# Patient Record
Sex: Female | Born: 1990 | Race: White | Hispanic: No | Marital: Married | Smoking: Never smoker
Health system: Southern US, Community
[De-identification: ages and names within clinical notes are randomized; demographics above are authoritative.]

## PROBLEM LIST (undated history)

## (undated) DIAGNOSIS — Z8489 Family history of other specified conditions: Secondary | ICD-10-CM

## (undated) HISTORY — PX: BREAST SURGERY: SHX581

---

## 2016-10-24 HISTORY — PX: BREAST ENHANCEMENT SURGERY: SHX7

## 2018-10-24 NOTE — L&D Delivery Note (Signed)
Delivery Note At 11:40 AM a viable female was delivered via Vaginal, Spontaneous (Presentation: ;  ).  APGAR: 9, 9; weight  .   Placenta status: , .  Cord:  with the following complications: .  Cord pH: not sent  Anesthesia:   Episiotomy: None Lacerations: 2nd degree Suture Repair: 2.0 chromic Est. Blood Loss (mL): 319  Mom to postpartum.  Baby to Couplet care / Skin to Skin. Mom plans to BF and maybe IUD pp Plans in patient circ Gabriela Giannelli A Yamile Roedl 05/26/2019, 12:37 PM

## 2018-10-25 LAB — OB RESULTS CONSOLE RUBELLA ANTIBODY, IGM: Rubella: IMMUNE

## 2018-10-31 LAB — OB RESULTS CONSOLE GC/CHLAMYDIA: Gonorrhea: NEGATIVE

## 2019-03-04 LAB — OB RESULTS CONSOLE GC/CHLAMYDIA: Chlamydia: NEGATIVE

## 2019-04-25 LAB — OB RESULTS CONSOLE HIV ANTIBODY (ROUTINE TESTING): HIV: NONREACTIVE

## 2019-04-28 LAB — OB RESULTS CONSOLE GBS: GBS: NEGATIVE

## 2019-05-26 ENCOUNTER — Encounter (HOSPITAL_COMMUNITY): Payer: Self-pay

## 2019-05-26 ENCOUNTER — Other Ambulatory Visit: Payer: Self-pay

## 2019-05-26 ENCOUNTER — Inpatient Hospital Stay (HOSPITAL_COMMUNITY)
Admission: AD | Admit: 2019-05-26 | Discharge: 2019-05-27 | DRG: 806 | Disposition: A | Discharge status: Home / Self Care | Payer: Managed Care, Other (non HMO) | Attending: Obstetrics & Gynecology | Admitting: Obstetrics & Gynecology

## 2019-05-26 DIAGNOSIS — Z3A39 39 weeks gestation of pregnancy: Secondary | ICD-10-CM

## 2019-05-26 DIAGNOSIS — Z20828 Contact with and (suspected) exposure to other viral communicable diseases: Secondary | ICD-10-CM | POA: Diagnosis present

## 2019-05-26 DIAGNOSIS — Z3490 Encounter for supervision of normal pregnancy, unspecified, unspecified trimester: Secondary | ICD-10-CM

## 2019-05-26 DIAGNOSIS — O26893 Other specified pregnancy related conditions, third trimester: Secondary | ICD-10-CM | POA: Diagnosis present

## 2019-05-26 LAB — CBC
HCT: 39.6 % (ref 36.0–46.0)
Hemoglobin: 13.2 g/dL (ref 12.0–15.0)
MCH: 29.5 pg (ref 26.0–34.0)
MCHC: 33.3 g/dL (ref 30.0–36.0)
MCV: 88.4 fL (ref 80.0–100.0)
Platelets: 188 10*3/uL (ref 150–400)
RBC: 4.48 MIL/uL (ref 3.87–5.11)
RDW: 12.6 % (ref 11.5–15.5)
WBC: 15.9 10*3/uL — ABNORMAL HIGH (ref 4.0–10.5)
nRBC: 0 % (ref 0.0–0.2)

## 2019-05-26 LAB — TYPE AND SCREEN
ABO/RH(D): B POS
Antibody Screen: NEGATIVE

## 2019-05-26 LAB — ABO/RH: ABO/RH(D): B POS

## 2019-05-26 LAB — SARS CORONAVIRUS 2 BY RT PCR (HOSPITAL ORDER, PERFORMED IN ~~LOC~~ HOSPITAL LAB): SARS Coronavirus 2: NEGATIVE

## 2019-05-26 LAB — RPR: RPR Ser Ql: NONREACTIVE

## 2019-05-26 MED ORDER — OXYTOCIN 40 UNITS IN NORMAL SALINE INFUSION - SIMPLE MED
2.5000 [IU]/h | INTRAVENOUS | Status: DC
  Filled 2019-05-26: qty 1000

## 2019-05-26 MED ORDER — COCONUT OIL OIL
1.0000 "application " | TOPICAL_OIL | Status: DC | PRN
  Administered 2019-05-26: 1 via TOPICAL

## 2019-05-26 MED ORDER — ONDANSETRON HCL 4 MG/2ML IJ SOLN: 4.0000 mg | INTRAMUSCULAR | Status: DC | PRN

## 2019-05-26 MED ORDER — WITCH HAZEL-GLYCERIN EX PADS: 1.0000 "application " | MEDICATED_PAD | CUTANEOUS | Status: DC | PRN

## 2019-05-26 MED ORDER — METHYLERGONOVINE MALEATE 0.2 MG PO TABS
0.2000 mg | ORAL_TABLET | Freq: Four times a day (QID) | ORAL | Status: DC
  Administered 2019-05-26 – 2019-05-27 (×5): 0.2 mg via ORAL
  Filled 2019-05-26 (×5): qty 1

## 2019-05-26 MED ORDER — SENNOSIDES-DOCUSATE SODIUM 8.6-50 MG PO TABS
2.0000 | ORAL_TABLET | ORAL | Status: DC
  Filled 2019-05-26: qty 2

## 2019-05-26 MED ORDER — DIPHENHYDRAMINE HCL 25 MG PO CAPS: 25.0000 mg | ORAL_CAPSULE | Freq: Four times a day (QID) | ORAL | Status: DC | PRN

## 2019-05-26 MED ORDER — SIMETHICONE 80 MG PO CHEW: 80.0000 mg | CHEWABLE_TABLET | ORAL | Status: DC | PRN

## 2019-05-26 MED ORDER — ZOLPIDEM TARTRATE 5 MG PO TABS: 5.0000 mg | ORAL_TABLET | Freq: Every evening | ORAL | Status: DC | PRN

## 2019-05-26 MED ORDER — ACETAMINOPHEN 325 MG PO TABS: 650.0000 mg | ORAL_TABLET | ORAL | Status: DC | PRN

## 2019-05-26 MED ORDER — LACTATED RINGERS IV SOLN
500.0000 mL | INTRAVENOUS | Status: DC | PRN
  Administered 2019-05-26: 500 mL via INTRAVENOUS

## 2019-05-26 MED ORDER — DIBUCAINE (PERIANAL) 1 % EX OINT: 1.0000 "application " | TOPICAL_OINTMENT | CUTANEOUS | Status: DC | PRN

## 2019-05-26 MED ORDER — IBUPROFEN 600 MG PO TABS
600.0000 mg | ORAL_TABLET | Freq: Four times a day (QID) | ORAL | Status: DC
  Filled 2019-05-26 (×4): qty 1

## 2019-05-26 MED ORDER — OXYCODONE-ACETAMINOPHEN 5-325 MG PO TABS: 1.0000 | ORAL_TABLET | ORAL | Status: DC | PRN

## 2019-05-26 MED ORDER — BENZOCAINE-MENTHOL 20-0.5 % EX AERO
1.0000 "application " | INHALATION_SPRAY | CUTANEOUS | Status: DC | PRN
  Administered 2019-05-26: 1 via TOPICAL
  Filled 2019-05-26: qty 56

## 2019-05-26 MED ORDER — LACTATED RINGERS IV SOLN
INTRAVENOUS | Status: DC
  Administered 2019-05-26: 05:00:00 via INTRAVENOUS

## 2019-05-26 MED ORDER — FLEET ENEMA 7-19 GM/118ML RE ENEM: 1.0000 | ENEMA | RECTAL | Status: DC | PRN

## 2019-05-26 MED ORDER — OXYCODONE-ACETAMINOPHEN 5-325 MG PO TABS: 2.0000 | ORAL_TABLET | ORAL | Status: DC | PRN

## 2019-05-26 MED ORDER — OXYTOCIN BOLUS FROM INFUSION
500.0000 mL | Freq: Once | INTRAVENOUS | Status: AC
  Administered 2019-05-26: 12:00:00 500 mL via INTRAVENOUS

## 2019-05-26 MED ORDER — MEASLES, MUMPS & RUBELLA VAC IJ SOLR: 0.5000 mL | Freq: Once | INTRAMUSCULAR | Status: DC

## 2019-05-26 MED ORDER — PRENATAL MULTIVITAMIN CH
1.0000 | ORAL_TABLET | Freq: Every day | ORAL | Status: DC
  Filled 2019-05-26: qty 1

## 2019-05-26 MED ORDER — LIDOCAINE HCL (PF) 1 % IJ SOLN
30.0000 mL | INTRAMUSCULAR | Status: AC | PRN
  Administered 2019-05-26: 30 mL via SUBCUTANEOUS
  Filled 2019-05-26: qty 30

## 2019-05-26 MED ORDER — FENTANYL CITRATE (PF) 100 MCG/2ML IJ SOLN
50.0000 ug | INTRAMUSCULAR | Status: DC | PRN
  Administered 2019-05-26 (×2): 100 ug via INTRAVENOUS
  Filled 2019-05-26 (×2): qty 2

## 2019-05-26 MED ORDER — SOD CITRATE-CITRIC ACID 500-334 MG/5ML PO SOLN: 30.0000 mL | ORAL | Status: DC | PRN

## 2019-05-26 MED ORDER — TETANUS-DIPHTH-ACELL PERTUSSIS 5-2.5-18.5 LF-MCG/0.5 IM SUSP: 0.5000 mL | Freq: Once | INTRAMUSCULAR | Status: DC

## 2019-05-26 MED ORDER — ONDANSETRON HCL 4 MG PO TABS: 4.0000 mg | ORAL_TABLET | ORAL | Status: DC | PRN

## 2019-05-26 MED ORDER — ONDANSETRON HCL 4 MG/2ML IJ SOLN
4.0000 mg | Freq: Four times a day (QID) | INTRAMUSCULAR | Status: DC | PRN
  Administered 2019-05-26: 4 mg via INTRAVENOUS
  Filled 2019-05-26: qty 2

## 2019-05-26 NOTE — H&P (Addendum)
Avaleen Brownley is a 28 y.o. female presenting with ROM at 0100 clear fluid per RN report.  Called by RN with report of ROM and contractions every 3-24min.  Pt reported to RN she was 1cm in the office on Thurs but cervix difficult to reach on exam in MAU by RN.  OB History    Gravida  1   Para      Term      Preterm      AB      Living        SAB      TAB      Ectopic      Multiple      Live Births             History reviewed. No pertinent past medical history.  Family History: DM, HTN, Malignant Melanoma, Breast CA, Dementia Social History:  has no history on file for tobacco, alcohol, and drug.     Maternal Diabetes: No Genetic Screening: Declined Maternal Ultrasounds/Referrals: Normal (rt CPC early in pregnancy resolved on ultrasound at 27wks) Fetal Ultrasounds or other Referrals:  None Maternal Substance Abuse:  No Significant Maternal Medications:  None Significant Maternal Lab Results:  Group B Strep negative Other Comments:  Rt CPC early in pregnancy resolved on ultrasound at 27wks  ROS  Non-contributory, no F/C/N/V/D  Maternal Medical History:  Reason for admission: Rupture of membranes and contractions.   Contractions: Onset was 1-2 hours ago.   Frequency: irregular.    Fetal activity: Perceived fetal activity is normal.    Prenatal complications: no prenatal complications Prenatal Complications - Diabetes: none.    Dilation: (unable to reach cervix, pt uncomfortable) Blood pressure 121/80, pulse 64, temperature 98.1 F (36.7 C), temperature source Oral, resp. rate 17, height 5\' 9"  (1.753 m), weight 82 kg. Exam Physical Exam  VE (last done in office) 1/70/-2 FHT 120s, + accels, no decels, mod variability Toco not picking up well but reported as every 3-38min U/S in office on 04/24/19 with EFW 5lbs 8oz, vtx, nl fluid  Prenatal labs: ABO, Rh:  B positive Antibody:  Negative Rubella: Immune (01/02 0000) RPR:   NR HBsAg:   NR HIV: Non-reactive  (07/02 0000)  GBS: Negative (07/05 0000)   Assessment/Plan: P0 at 39 5/7 wks with SROM at 0100 with contractions every 3-66min and GBS neg.  Uncomplicated PN course.  Will observe and augment labor as indicated.  Cat 1 tracing.  Pt may have pain medication upon request.   Margaret [name] 05/26/2019, 3:47 AM

## 2019-05-26 NOTE — Progress Notes (Signed)
Pt without c/o BP 115/81 (BP Location: Right Arm)   Pulse 77   Temp 98.9 F (37.2 C) (Oral)   Resp 19   Ht 5\' 9"  (1.753 m)   Wt 82 kg   Breastfeeding Unknown   BMI 26.70 kg/m  Fundus firm  Small amount of lochia noted PPH Pitocin and methergine given Pt stable.  Will monitor.

## 2019-05-26 NOTE — MAU Note (Signed)
Pt reports water broke at 0100, clear fluid. Contractions started soon after, however not timing them. Denies vaginal bleeding. Reports good fetal movement. Cervix was 1/70 on Thursday.

## 2019-05-26 NOTE — Lactation Note (Signed)
This note was copied from a baby's chart. Lactation Consultation Note  Patient Name: Margaret [name] Date: 05/26/2019 Reason for consult: Initial assessment;Term;Primapara;1st time breastfeeding  P1 mother whose infant is now 21 hours old.  Baby was quiet and awake in father's arms when I arrived.  Mother has breast fed him twice since delivery.  Mother had a breast augmentation in 2018.    Mother's breasts are firm and nipples are short shafted bilaterally; left nipple almost flat.  The right nipple is reddened and slightly irritated.  Mother stated that baby latched "so quickly and fed hard"  Explained the importance of obtaining and maintaining a good deep latch.  Mother verbalized understanding.  Breast shells and manual pump provided with instructions for use.  #24 flange size is appropriate at this time.  RN has provided coconut oil and I encouraged using EBM to rub into nipples/areolas after feedings.  Mother will feed 8-12 times/24 hours or sooner if baby shows feeding cues.  Reviewed cues.  Colostrum container provided for any EBM she may obtain with hand expression.  Milk storage times discussed and finger feeding demonstrated.  Mother has a Medela DEBP for home use.  Mom made aware of O/P services, breastfeeding support groups, community resources, and our phone # for post-discharge questions. Father supportive.  RN updated.   Maternal Data Formula Feeding for Exclusion: No Has patient been taught Hand Expression?: Yes Does the patient have breastfeeding experience prior to this delivery?: No  Feeding Feeding Type: Breast Fed  LATCH Score Latch: Grasps breast easily, tongue down, lips flanged, rhythmical sucking.  Audible Swallowing: A few with stimulation  Type of Nipple: Everted at rest and after stimulation(short)  Comfort (Breast/Nipple): Filling, red/small blisters or bruises, mild/mod discomfort  Hold (Positioning): Assistance needed to correctly position  infant at breast and maintain latch.  LATCH Score: 7  Interventions Interventions: Breast feeding basics reviewed;Assisted with latch;Skin to skin;Breast massage  Lactation Tools Discussed/Used WIC Program: No Pump Review: Setup, frequency, and cleaning;Milk Storage Initiated by:: Shephanie Romas Date initiated:: 05/26/19   Consult Status Consult Status: Follow-up Date: 05/27/19 Follow-up type: In-patient    Little Ishikawa 05/26/2019, 3:51 PM

## 2019-05-27 LAB — CBC
HCT: 32.6 % — ABNORMAL LOW (ref 36.0–46.0)
Hemoglobin: 11.1 g/dL — ABNORMAL LOW (ref 12.0–15.0)
MCH: 29.4 pg (ref 26.0–34.0)
MCHC: 34 g/dL (ref 30.0–36.0)
MCV: 86.5 fL (ref 80.0–100.0)
Platelets: 149 10*3/uL — ABNORMAL LOW (ref 150–400)
RBC: 3.77 MIL/uL — ABNORMAL LOW (ref 3.87–5.11)
RDW: 12.6 % (ref 11.5–15.5)
WBC: 17.4 10*3/uL — ABNORMAL HIGH (ref 4.0–10.5)
nRBC: 0 % (ref 0.0–0.2)

## 2019-05-27 NOTE — Discharge Summary (Signed)
Obstetric Discharge Summary Reason for Admission: rupture of membranes Prenatal Procedures: NST Intrapartum Procedures: spontaneous vaginal delivery Postpartum Procedures: none Complications-Operative and Postpartum: none Hemoglobin  Date Value Ref Range Status  05/27/2019 11.1 (L) 12.0 - 15.0 g/dL Final   HCT  Date Value Ref Range Status  05/27/2019 32.6 (L) 36.0 - 46.0 % Final    Physical Exam:  General: alert, cooperative and no distress Lochia: appropriate Uterine Fundus: firm perineum: healing well DVT Evaluation: No evidence of DVT seen on physical exam. Negative Homan's sign. No cords or calf tenderness.  Discharge Diagnoses: Term Pregnancy-delivered  Discharge Information: Date: 05/27/2019 Activity: pelvic rest Diet: routine Medications: PNV Condition: stable Instructions: refer to practice specific booklet Discharge to: home   Newborn Data: Live born female  Birth Weight: 8 lb 1.3 oz (3666 g) APGAR: 32, 9  Newborn Delivery   Birth date/time: 05/26/2019 11:40:00 Delivery type: Vaginal, Spontaneous      Home with mother.  Sanjuana Kava STACIA 05/27/2019, 11:20 AM

## 2019-05-27 NOTE — Lactation Note (Signed)
This note was copied from a baby's chart. Lactation Consultation Note  Patient Name: Margaret [name] [id number] Date: 05/27/2019 Reason for consult: Follow-up assessment;Term;Primapara;1st time breastfeeding  P1 mother whose infant is now 77 hours old.  Baby was asleep in mother's arms when I arrived.  Discussed circumcision and its possible effects on breast feeding.    Mother is pleased that baby is latching and feeding well.  She has gotten good results from wearing the breast shells and does not even have to use the manual pump to help evert nipples prior to latching.  She will continue to feed 8-12 times/24 hours of sooner if baby shows feeding cues.    Discussed cluster feeding.  Engorgement prevention/treatment reviewed.  Mother has a DEBP for home use.  She also has our OP phone number for any questions/concerns after discharge.    Father present and supportive.   Maternal Data Formula Feeding for Exclusion: No Has patient been taught Hand Expression?: Yes Does the patient have breastfeeding experience prior to this delivery?: No  Feeding Feeding Type: Breast Fed  LATCH Score Latch: Grasps breast easily, tongue down, lips flanged, rhythmical sucking.  Audible Swallowing: A few with stimulation  Type of Nipple: Everted at rest and after stimulation  Comfort (Breast/Nipple): Soft / non-tender  Hold (Positioning): Assistance needed to correctly position infant at breast and maintain latch.  LATCH Score: 8  Interventions    Lactation Tools Discussed/Used WIC Program: No   Consult Status Consult Status: Complete Date: 05/27/19 Follow-up type: Call as needed    Margaret [name] 05/27/2019, 1:48 PM

## 2019-05-28 ENCOUNTER — Inpatient Hospital Stay (HOSPITAL_COMMUNITY)
Admission: AD | Admit: 2019-05-28 | Payer: Managed Care, Other (non HMO) | Source: Home / Self Care | Admitting: Obstetrics and Gynecology

## 2021-01-28 ENCOUNTER — Inpatient Hospital Stay (HOSPITAL_COMMUNITY)
Admission: AD | Admit: 2021-01-28 | Discharge: 2021-01-28 | Disposition: A | Discharge status: Home / Self Care | Payer: PRIVATE HEALTH INSURANCE | Attending: Obstetrics & Gynecology | Admitting: Obstetrics & Gynecology

## 2021-01-28 ENCOUNTER — Encounter (HOSPITAL_COMMUNITY): Payer: Self-pay | Admitting: *Deleted

## 2021-01-28 ENCOUNTER — Inpatient Hospital Stay (HOSPITAL_COMMUNITY): Payer: PRIVATE HEALTH INSURANCE

## 2021-01-28 DIAGNOSIS — O3680X0 Pregnancy with inconclusive fetal viability, not applicable or unspecified: Secondary | ICD-10-CM | POA: Diagnosis not present

## 2021-01-28 DIAGNOSIS — Z3A01 Less than 8 weeks gestation of pregnancy: Secondary | ICD-10-CM | POA: Diagnosis not present

## 2021-01-28 DIAGNOSIS — O209 Hemorrhage in early pregnancy, unspecified: Secondary | ICD-10-CM | POA: Insufficient documentation

## 2021-01-28 DIAGNOSIS — Z3A00 Weeks of gestation of pregnancy not specified: Secondary | ICD-10-CM | POA: Diagnosis not present

## 2021-01-28 LAB — CBC
HCT: 42.5 % (ref 36.0–46.0)
Hemoglobin: 14.1 g/dL (ref 12.0–15.0)
MCH: 28.5 pg (ref 26.0–34.0)
MCHC: 33.2 g/dL (ref 30.0–36.0)
MCV: 85.9 fL (ref 80.0–100.0)
Platelets: 265 10*3/uL (ref 150–400)
RBC: 4.95 MIL/uL (ref 3.87–5.11)
RDW: 12.3 % (ref 11.5–15.5)
WBC: 10.4 10*3/uL (ref 4.0–10.5)
nRBC: 0 % (ref 0.0–0.2)

## 2021-01-28 LAB — COMPREHENSIVE METABOLIC PANEL
ALT: 15 U/L (ref 0–44)
AST: 17 U/L (ref 15–41)
Albumin: 4 g/dL (ref 3.5–5.0)
Alkaline Phosphatase: 49 U/L (ref 38–126)
Anion gap: 6 (ref 5–15)
BUN: 9 mg/dL (ref 6–20)
CO2: 24 mmol/L (ref 22–32)
Calcium: 9.1 mg/dL (ref 8.9–10.3)
Chloride: 107 mmol/L (ref 98–111)
Creatinine, Ser: 0.73 mg/dL (ref 0.44–1.00)
GFR, Estimated: >60 mL/min (ref >60)
Glucose, Bld: 95 mg/dL (ref 70–99)
Potassium: 4 mmol/L (ref 3.5–5.1)
Sodium: 137 mmol/L (ref 135–145)
Total Bilirubin: 0.7 mg/dL (ref 0.3–1.2)
Total Protein: 6.6 g/dL (ref 6.5–8.1)

## 2021-01-28 LAB — TYPE AND SCREEN
ABO/RH(D): B POS
Antibody Screen: NEGATIVE

## 2021-01-28 LAB — POCT PREGNANCY, URINE: Preg Test, Ur: POSITIVE — AB

## 2021-01-28 LAB — HCG, QUANTITATIVE, PREGNANCY: hCG, Beta Chain, Quant, S: 17 m[IU]/mL — ABNORMAL HIGH (ref <5)

## 2021-01-28 NOTE — MAU Note (Signed)
Pt reports  She had a positive HPT  LMP 12/13/20. Had some bleeding today when she went to [facility]. Denies any pain or cramping. No recent intercourse.

## 2021-01-28 NOTE — MAU Provider Note (Signed)
History     [id number]  Arrival date and time: 01/28/21 1287    Chief Complaint  Patient presents with  . Vaginal Bleeding     HPI Margaret [name] is a 30 y.o. at [redacted]w[redacted]d by uncertain LMP, who presents for vaginal bleeding.   Patient goes to see [facility] Reports she has been taking oral contraceptives since her last delivery Last week she took a pregnancy test and it was positive Today she reports that she has had light vaginal bleeding that is ongoing especially presented for care She reports mild lower abdominal tightness but no pain No nausea or vomiting Pregnancy is an unplanned but highly desired --/--/PENDING (04/07 1121)  OB History    Gravida  2   Para  1   Term  1   Preterm      AB      Living  1     SAB      IAB      Ectopic      Multiple  0   Live Births  1           History reviewed. No pertinent past medical history.  Past Surgical History:  Procedure Laterality Date  . BREAST ENHANCEMENT SURGERY  2018    Family History  Problem Relation Age of Onset  . Cancer Maternal Grandmother   . Diabetes Mother     Social History   Socioeconomic History  . Marital status: Married    Spouse name: Not on file  . Number of children: Not on file  . Years of education: Not on file  . Highest education level: Not on file  Occupational History  . Not on file  Tobacco Use  . Smoking status: Never Smoker  . Smokeless tobacco: Never Used  Vaping Use  . Vaping Use: Never used  Substance and Sexual Activity  . Alcohol use: Not Currently  . Drug use: Not Currently  . Sexual activity: Yes  Other Topics Concern  . Not on file  Social History Narrative  . Not on file   Social Determinants of Health   Financial Resource Strain: Not on file  Food Insecurity: Not on file  Transportation Needs: Not on file  Physical Activity: Not on file  Stress: Not on file  Social Connections: Not on file  Intimate Partner Violence: Not on file    No Known  Allergies  No current facility-administered medications on file prior to encounter.   Current Outpatient Medications on File Prior to Encounter  Medication Sig Dispense Refill  . Prenatal Vit-Fe Fumarate-FA (MULTIVITAMIN-PRENATAL) 27-0.8 MG TABS tablet Take 1 tablet by mouth daily at 12 noon.       ROS Pertinent positives and negative per HPI, all others reviewed and negative  Physical Exam   BP 123/80 (BP Location: Right Arm)   Pulse 84   Temp 98 F (36.7 C) (Oral)   Resp 20   Ht 5\' 9"  (1.753 m)   Wt 76.7 kg   LMP 12/13/2020   SpO2 99%   BMI 24.96 kg/m   Patient Vitals for the past 24 hrs:  BP Temp Temp src Pulse Resp SpO2 Height Weight  01/28/21 1037 123/80 98 F (36.7 C) Oral 84 20 99 % -- --  01/28/21 1009 113/89 98.1 F (36.7 C) -- 81 18 -- 5\' 9"  (1.753 m) 76.7 kg    Physical Exam Vitals reviewed.  Constitutional:      General: She is not in acute distress.  Appearance: She is well-developed. She is not diaphoretic.  Eyes:     General: No scleral icterus. Pulmonary:     Effort: Pulmonary effort is normal. No respiratory distress.  Abdominal:     General: There is no distension.     Palpations: Abdomen is soft.     Tenderness: There is no abdominal tenderness. There is no guarding or rebound.  Skin:    General: Skin is warm and dry.  Neurological:     Mental Status: She is alert.     Coordination: Coordination normal.      Cervical Exam    Bedside Ultrasound Pt informed that the ultrasound is considered a limited OB ultrasound and is not intended to be a complete ultrasound exam.  Patient also informed that the ultrasound is not being completed with the intent of assessing for fetal or placental anomalies or any pelvic abnormalities.  Explained that the purpose of today's ultrasound is to assess for  viability.  Patient acknowledges the purpose of the exam and the limitations of the study.    My interpretation: unremarkable adnexa bilaterally, no  IUP visualized though there is some indistinct structure in LUS  FHT N/a  Labs Results for orders placed or performed during the hospital encounter of 01/28/21 (from the past 24 hour(s))  Pregnancy, urine POC     Status: Abnormal   Collection Time: 01/28/21 10:08 AM  Result Value Ref Range   Preg Test, Ur POSITIVE (A) NEGATIVE  Type and screen [facility]     Status: None (Preliminary result)   Collection Time: 01/28/21 11:21 AM  Result Value Ref Range   ABO/RH(D) PENDING    Antibody Screen PENDING    Sample Expiration      01/31/2021,2359 Performed at [facility], [address], [place], [place] [address]     Imaging US OB LESS THAN 14 WEEKS WITH [place] TRANSVAGINAL  Result Date: 01/28/2021 CLINICAL DATA:  30 year old female with pregnancy of unknown location. Estimated gestational age by LMP 6 weeks and 4 days. EXAM: OBSTETRIC <14 WK [place] AND TRANSVAGINAL OB US TECHNIQUE: Both transabdominal and transvaginal ultrasound examinations were performed for complete evaluation of the gestation as well as the maternal uterus, adnexal regions, and pelvic cul-de-sac. Transvaginal technique was performed to assess early pregnancy. COMPARISON:  None. FINDINGS: Intrauterine gestational sac: None. Maternal uterus/adnexae: Bland appearance of the endometrium (image 51), measuring 8-9 mm in thickness. No pelvic free fluid. Both ovaries appear normal. The right ovary measures 1.8 x 1.3 x 2.4 cm. The left ovary is 2.5 x 2.6 x 2.2 cm. IMPRESSION: No IUP, ovarian abnormality, or pelvic free fluid. Differential considerations include early IUP, failed IUP, occult ectopic pregnancy. Recommend serial quantitative beta HCG and repeat Ultrasound as necessary. Electronically Signed   By: Odessa Fleming M.D.   On: 01/28/2021 11:59    MAU Course  Procedures  Lab Orders     CBC     Comprehensive metabolic panel     hCG, quantitative, pregnancy     Pregnancy, urine POC No orders of the defined  types were placed in this encounter.   Imaging Orders     US OB LESS THAN 14 WEEKS WITH OB TRANSVAGINAL  MDM moderate  Assessment and Plan  #Pregnancy of unknown location Ultrasound without IUP or any other abnormality. Prior blood type on file is B+, repeat pending. Other labs pending. Will have her return in 2 days for recheck of hcg. Reviewed ectopic precautions.  Discharged to home in stable condition.  Venora Maples, MD/MPH 01/28/21 1:02 PM  Allergies as of 01/28/2021   No Known Allergies     Medication List    TAKE these medications   multivitamin-prenatal 27-0.8 MG Tabs tablet Take 1 tablet by mouth daily at 12 noon.

## 2021-01-28 NOTE — Discharge Instructions (Signed)
Abdominal Pain During Pregnancy Belly (abdominal) pain is common during pregnancy. There are many possible causes. Some causes are more serious than others. Sometimes the cause is not known. Always tell your doctor if you have belly pain. Follow these instructions at home:  Do not have sex or put anything in your vagina until your pain goes away completely.  Get plenty of rest until your pain gets better.  Drink enough fluid to keep your pee (urine) pale yellow.  Take over-the-counter and prescription medicines only as told by your doctor.  Keep all follow-up visits.   Contact a doctor if:  You keep having pain after resting.  Your pain gets worse after resting.  You have lower belly pain that: ? Comes and goes at regular times. ? Spreads to your back. ? Feels like menstrual cramps.  You have pain or burning when you pee (urinate). Get help right away if:  You have a fever or chills.  You feel like it is hard to breathe.  You have bleeding from your vagina.  You are leaking fluid or tissue from your vagina.  You vomit for more than 24 hours.  You have watery poop (diarrhea) for more than 24 hours.  Your baby is moving less than usual.  You feel very weak or faint.  You have very bad pain in your upper belly. Summary  Belly pain is common during pregnancy. There are many possible causes.  If you have belly pain during pregnancy, tell your doctor right away.  Keep all follow-up visits. This information is not intended to replace advice given to you by your health care provider. Make sure you discuss any questions you have with your health care provider. Document Revised: 06/23/2020 Document Reviewed: 06/23/2020 Elsevier Patient Education  2021 Elsevier Inc.  

## 2021-01-30 ENCOUNTER — Inpatient Hospital Stay (HOSPITAL_COMMUNITY)
Admission: AD | Admit: 2021-01-30 | Discharge: 2021-01-30 | Disposition: A | Discharge status: Home / Self Care | Payer: PRIVATE HEALTH INSURANCE | Attending: Obstetrics & Gynecology | Admitting: Obstetrics & Gynecology

## 2021-01-30 ENCOUNTER — Other Ambulatory Visit: Payer: Self-pay

## 2021-01-30 DIAGNOSIS — O039 Complete or unspecified spontaneous abortion without complication: Secondary | ICD-10-CM | POA: Diagnosis present

## 2021-01-30 DIAGNOSIS — Z3A01 Less than 8 weeks gestation of pregnancy: Secondary | ICD-10-CM | POA: Insufficient documentation

## 2021-01-30 LAB — HCG, QUANTITATIVE, PREGNANCY: hCG, Beta Chain, Quant, S: 5 m[IU]/mL — ABNORMAL HIGH (ref <5)

## 2021-01-30 NOTE — MAU Provider Note (Signed)
History   Chief Complaint:  Follow-up   Margaret [name] is  30 y.o. G2P1001 Patient's last menstrual period was 12/13/2020.Marland Kitchen Patient is here for follow up of quantitative HCG and ongoing surveillance of pregnancy status. She is [redacted]w[redacted]d weeks gestation  by LMP.    Since her last visit, the patient is without new complaint. The patient reports bleeding as  none now.  She denies any pain.  General ROS:  negative  Her previous Quantitative HCG values are:  Results for [name], [name] (MRN [id number]) as of 01/30/2021 11:58  Ref. Range 01/28/2021 11:21  HCG, Beta Chain, Quant, S Latest Ref Range: <5 mIU/mL 17 (H)   Physical Exam   Blood pressure 118/86, pulse 73, temperature 98.2 F (36.8 C), temperature source Oral, resp. rate 16, last menstrual period 12/13/2020, SpO2 100 %, unknown if currently breastfeeding.  Physical Exam Vitals and nursing note reviewed.  Constitutional:      General: She is not in acute distress.    Appearance: She is well-developed.  HENT:     Head: Normocephalic.  Eyes:     Pupils: Pupils are equal, round, and reactive to light.  Cardiovascular:     Rate and Rhythm: Normal rate.  Pulmonary:     Effort: Pulmonary effort is normal. No respiratory distress.  Musculoskeletal:        General: Normal range of motion.     Cervical back: Normal range of motion.  Skin:    General: Skin is warm and dry.  Neurological:     Mental Status: She is alert and oriented to person, place, and time.  Psychiatric:        Behavior: Behavior normal.        Thought Content: Thought content normal.        Judgment: Judgment normal.    Labs: Results for orders placed or performed during the hospital encounter of 01/30/21 (from the past 24 hour(s))  hCG, quantitative, pregnancy   Collection Time: 01/30/21 10:38 AM  Result Value Ref Range   hCG, Beta Chain, Quant, S 5 (H) <5 mIU/mL    Assessment:   1. Miscarriage   2. [redacted] weeks gestation of pregnancy     -Reviewed results with  patient. Discussed she has likely experienced all of the bleeding she will have with this process but also reviewed possibility of more pain or bleeding. Discussed warning signs at length. Patient desires to follow up with [facility]   Plan: -Discharge home in stable condition -Vaginal bleeding and pain precautions discussed -Patient advised to follow-up with OB in 1 week for repeat labs, patient will call office.  -Patient may return to MAU as needed or if her condition were to change or worsen  Rolm Bookbinder, CNM 01/30/2021, 11:35 AM

## 2021-01-30 NOTE — MAU Note (Signed)
Pt reports to mau for follow up lab work.  Pt states her bleeding has lightened to spotting.  Pt denies any pain today.

## 2021-05-05 LAB — OB RESULTS CONSOLE RUBELLA ANTIBODY, IGM: Rubella: IMMUNE

## 2021-05-05 LAB — OB RESULTS CONSOLE ABO/RH: RH Type: POSITIVE

## 2021-05-05 LAB — OB RESULTS CONSOLE ANTIBODY SCREEN: Antibody Screen: NEGATIVE

## 2021-05-05 LAB — OB RESULTS CONSOLE GC/CHLAMYDIA
Chlamydia: NEGATIVE
Gonorrhea: NEGATIVE

## 2021-05-05 LAB — OB RESULTS CONSOLE HIV ANTIBODY (ROUTINE TESTING): HIV: NONREACTIVE

## 2021-05-05 LAB — OB RESULTS CONSOLE HEPATITIS B SURFACE ANTIGEN: Hepatitis B Surface Ag: NEGATIVE

## 2021-05-05 LAB — HEPATITIS C ANTIBODY: HCV Ab: NEGATIVE

## 2021-05-05 LAB — OB RESULTS CONSOLE RPR: RPR: NONREACTIVE

## 2021-10-24 NOTE — L&D Delivery Note (Signed)
Delivery Note   Patient Name: Margaret [name] DOB: 01/23/1991 MRN: [id number]  Date of admission: 12/04/2021 Delivering MD: [place]  Date of delivery: 12/04/21 Type of delivery: SVD  Newborn Data: Live born female  Birth Weight: 6 lb 10.5 oz (3020 g) APGAR: 8, 9  Newborn Delivery   Birth date/time: 12/04/2021 17:35:00 Delivery type: Vaginal, Spontaneous     Margaret [name], 31 y.o., @ [redacted]w[redacted]d,  [phone], who was admitted for Margaret [name] is a 31 y.o. female, G2P1001, IUP at 40.2 weeks, presenting for pt was having painful cxt, pt made no cervical change in MAU has remained stable at 3cm over last four hour, pt verbalized desire to stay and ha an elective IOL. H/O bilat breast augmentation, and H/O PPH. GBS-. Pt progress to 10 cm with AROM, clear. I was called to the room when she progressed 2+ station in the second stage of labor.  She pushed for 15/min.  She delivered a viable infant, cephalic and restituted to the ROA position over an intact perineum.  A nuchal cord   was not identified. There was a right compound hand. The baby was placed on maternal abdomen while initial step of NRP were perfmored (Dry, Stimulated, and warmed). Hat placed on baby for thermoregulation. Delayed cord clamping was performed for 2 minutes.  Cord double clamped and cut.  Cord cut by FOB. Newborn has full body pustular rash noted sparing palms and sole of the feet, Peds called to assess at bedside. Apgar scores were 8 and 9. Prophylactic Pitocin was started in the third stage of labor for active management. The placenta delivered spontaneously, shultz, with a 3 vessel cord and was sent to pathology r/t newborn rash, and yellow dis coloration of the placenta.  Inspection revealed 1st degree. An examination of the vaginal vault and cervix was free from lacerations. The uterus was firm, bleeding stable.  The repair was done under epidural.  Umbilical artery blood gas were not sent.  There were no complications during the  procedure.  Mom and baby skin to skin following delivery. Left in stable condition.  Maternal Info: Anesthesia: Epidural Episiotomy: no Lacerations:  1st Suture Repair: 3.o vicryl Est. Blood Loss (mL):   Newborn Info:  Baby Sex: female Circumcision: in pt desired Babies Name: Margaret [name] APGAR (1 MIN): 8   APGAR (5 MINS): 9   APGAR (10 MINS):     Mom to postpartum.  Baby to Couplet care / Skin to Skin.  Dr Normand Sloop updated.   [place], [place], NP-C 12/04/21 6:17 PM

## 2021-11-11 LAB — OB RESULTS CONSOLE GBS
GBS: NEGATIVE
GBS: NEGATIVE

## 2021-11-26 ENCOUNTER — Telehealth (HOSPITAL_COMMUNITY): Payer: Self-pay | Admitting: *Deleted

## 2021-11-26 ENCOUNTER — Other Ambulatory Visit: Payer: Self-pay | Admitting: Obstetrics & Gynecology

## 2021-11-26 NOTE — Telephone Encounter (Signed)
Preadmission screen  

## 2021-11-29 ENCOUNTER — Telehealth (HOSPITAL_COMMUNITY): Payer: Self-pay | Admitting: *Deleted

## 2021-11-29 NOTE — Telephone Encounter (Signed)
Preadmission screen  

## 2021-11-30 ENCOUNTER — Telehealth (HOSPITAL_COMMUNITY): Payer: Self-pay | Admitting: *Deleted

## 2021-11-30 NOTE — Telephone Encounter (Signed)
Preadmission screen  

## 2021-12-01 ENCOUNTER — Telehealth (HOSPITAL_COMMUNITY): Payer: Self-pay | Admitting: *Deleted

## 2021-12-01 NOTE — Telephone Encounter (Signed)
Preadmission screen  

## 2021-12-02 ENCOUNTER — Encounter (HOSPITAL_COMMUNITY): Payer: Self-pay | Admitting: *Deleted

## 2021-12-02 ENCOUNTER — Telehealth (HOSPITAL_COMMUNITY): Payer: Self-pay | Admitting: *Deleted

## 2021-12-02 NOTE — Telephone Encounter (Signed)
Preadmission screen  

## 2021-12-04 ENCOUNTER — Inpatient Hospital Stay (HOSPITAL_COMMUNITY)
Admission: AD | Admit: 2021-12-04 | Discharge: 2021-12-06 | DRG: 806 | Disposition: A | Discharge status: Home / Self Care | Payer: PRIVATE HEALTH INSURANCE | Attending: Obstetrics & Gynecology | Admitting: Obstetrics & Gynecology

## 2021-12-04 ENCOUNTER — Other Ambulatory Visit: Payer: Self-pay

## 2021-12-04 ENCOUNTER — Inpatient Hospital Stay (HOSPITAL_COMMUNITY): Payer: PRIVATE HEALTH INSURANCE | Admitting: Anesthesiology

## 2021-12-04 ENCOUNTER — Encounter (HOSPITAL_COMMUNITY): Payer: Self-pay | Admitting: Obstetrics and Gynecology

## 2021-12-04 DIAGNOSIS — Z3A40 40 weeks gestation of pregnancy: Secondary | ICD-10-CM

## 2021-12-04 DIAGNOSIS — D62 Acute posthemorrhagic anemia: Secondary | ICD-10-CM | POA: Diagnosis not present

## 2021-12-04 DIAGNOSIS — Z20822 Contact with and (suspected) exposure to covid-19: Secondary | ICD-10-CM | POA: Diagnosis present

## 2021-12-04 DIAGNOSIS — O9081 Anemia of the puerperium: Secondary | ICD-10-CM | POA: Diagnosis not present

## 2021-12-04 DIAGNOSIS — O480 Post-term pregnancy: Principal | ICD-10-CM | POA: Diagnosis present

## 2021-12-04 DIAGNOSIS — O26893 Other specified pregnancy related conditions, third trimester: Secondary | ICD-10-CM | POA: Diagnosis present

## 2021-12-04 DIAGNOSIS — O479 False labor, unspecified: Secondary | ICD-10-CM

## 2021-12-04 DIAGNOSIS — Z3490 Encounter for supervision of normal pregnancy, unspecified, unspecified trimester: Secondary | ICD-10-CM | POA: Diagnosis present

## 2021-12-04 HISTORY — DX: Family history of other specified conditions: Z84.89

## 2021-12-04 LAB — CBC
HCT: 37 % (ref 36.0–46.0)
HCT: 27.5 % — ABNORMAL LOW (ref 36.0–46.0)
Hemoglobin: 12.5 g/dL (ref 12.0–15.0)
Hemoglobin: 9.4 g/dL — ABNORMAL LOW (ref 12.0–15.0)
MCH: 29.3 pg (ref 26.0–34.0)
MCH: 29.5 pg (ref 26.0–34.0)
MCHC: 33.8 g/dL (ref 30.0–36.0)
MCHC: 34.2 g/dL (ref 30.0–36.0)
MCV: 86.9 fL (ref 80.0–100.0)
MCV: 86.2 fL (ref 80.0–100.0)
Platelets: 178 10*3/uL (ref 150–400)
Platelets: 182 10*3/uL (ref 150–400)
RBC: 4.26 MIL/uL (ref 3.87–5.11)
RBC: 3.19 MIL/uL — ABNORMAL LOW (ref 3.87–5.11)
RDW: 13.7 % (ref 11.5–15.5)
RDW: 13.4 % (ref 11.5–15.5)
WBC: 16.8 10*3/uL — ABNORMAL HIGH (ref 4.0–10.5)
WBC: 22.2 10*3/uL — ABNORMAL HIGH (ref 4.0–10.5)
nRBC: 0 % (ref 0.0–0.2)
nRBC: 0 % (ref 0.0–0.2)

## 2021-12-04 LAB — RESP PANEL BY RT-PCR (FLU A&B, COVID) ARPGX2
Influenza A by PCR: NEGATIVE
Influenza B by PCR: NEGATIVE
SARS Coronavirus 2 by RT PCR: NEGATIVE

## 2021-12-04 LAB — TYPE AND SCREEN
ABO/RH(D): B POS
Antibody Screen: NEGATIVE

## 2021-12-04 MED ORDER — DIBUCAINE (PERIANAL) 1 % EX OINT: 1.0000 "application " | TOPICAL_OINTMENT | CUTANEOUS | Status: DC | PRN

## 2021-12-04 MED ORDER — LIDOCAINE HCL (PF) 1 % IJ SOLN: 30.0000 mL | INTRAMUSCULAR | Status: DC | PRN

## 2021-12-04 MED ORDER — METHYLERGONOVINE MALEATE 0.2 MG PO TABS
0.2000 mg | ORAL_TABLET | Freq: Four times a day (QID) | ORAL | Status: AC
  Administered 2021-12-05 (×4): 0.2 mg via ORAL
  Filled 2021-12-04 (×4): qty 1

## 2021-12-04 MED ORDER — OXYCODONE-ACETAMINOPHEN 5-325 MG PO TABS: 1.0000 | ORAL_TABLET | ORAL | Status: DC | PRN

## 2021-12-04 MED ORDER — POLYSACCHARIDE IRON COMPLEX 150 MG PO CAPS
150.0000 mg | ORAL_CAPSULE | Freq: Every day | ORAL | Status: DC
  Administered 2021-12-05 – 2021-12-06 (×2): 150 mg via ORAL
  Filled 2021-12-04 (×2): qty 1

## 2021-12-04 MED ORDER — SOD CITRATE-CITRIC ACID 500-334 MG/5ML PO SOLN: 30.0000 mL | ORAL | Status: DC | PRN

## 2021-12-04 MED ORDER — PHENYLEPHRINE 40 MCG/ML (10ML) SYRINGE FOR IV PUSH (FOR BLOOD PRESSURE SUPPORT)
80.0000 ug | PREFILLED_SYRINGE | INTRAVENOUS | Status: DC | PRN
  Filled 2021-12-04: qty 10

## 2021-12-04 MED ORDER — FENTANYL-BUPIVACAINE-NACL 0.5-0.125-0.9 MG/250ML-% EP SOLN
12.0000 mL/h | EPIDURAL | Status: DC | PRN
  Administered 2021-12-04: 12 mL/h via EPIDURAL
  Filled 2021-12-04: qty 250

## 2021-12-04 MED ORDER — CARBOPROST TROMETHAMINE 250 MCG/ML IM SOLN
INTRAMUSCULAR | Status: AC
  Administered 2021-12-04: 250 ug
  Filled 2021-12-04: qty 1

## 2021-12-04 MED ORDER — LACTATED RINGERS IV BOLUS: 1000.0000 mL | Freq: Once | INTRAVENOUS | Status: AC

## 2021-12-04 MED ORDER — TRANEXAMIC ACID-NACL 1000-0.7 MG/100ML-% IV SOLN
INTRAVENOUS | Status: AC
  Administered 2021-12-04: 1000 mg
  Filled 2021-12-04: qty 100

## 2021-12-04 MED ORDER — LACTATED RINGERS IV SOLN: INTRAVENOUS | Status: DC

## 2021-12-04 MED ORDER — EPHEDRINE 5 MG/ML INJ: 10.0000 mg | INTRAVENOUS | Status: DC | PRN

## 2021-12-04 MED ORDER — FENTANYL CITRATE (PF) 100 MCG/2ML IJ SOLN
50.0000 ug | INTRAMUSCULAR | Status: DC | PRN
  Filled 2021-12-04: qty 2

## 2021-12-04 MED ORDER — TRANEXAMIC ACID-NACL 1000-0.7 MG/100ML-% IV SOLN: 1000.0000 mg | INTRAVENOUS | Status: AC

## 2021-12-04 MED ORDER — LIDOCAINE HCL (PF) 1 % IJ SOLN
INTRAMUSCULAR | Status: DC | PRN
  Administered 2021-12-04 (×2): 5 mL via EPIDURAL

## 2021-12-04 MED ORDER — ONDANSETRON HCL 4 MG/2ML IJ SOLN
4.0000 mg | Freq: Once | INTRAMUSCULAR | Status: AC
  Administered 2021-12-04: 4 mg via INTRAVENOUS

## 2021-12-04 MED ORDER — COCONUT OIL OIL: 1.0000 "application " | TOPICAL_OIL | Status: DC | PRN

## 2021-12-04 MED ORDER — ACETAMINOPHEN 325 MG PO TABS: 650.0000 mg | ORAL_TABLET | ORAL | Status: DC | PRN

## 2021-12-04 MED ORDER — OXYTOCIN-SODIUM CHLORIDE 30-0.9 UT/500ML-% IV SOLN: 2.5000 [IU]/h | INTRAVENOUS | Status: DC

## 2021-12-04 MED ORDER — DIPHENHYDRAMINE HCL 25 MG PO CAPS
25.0000 mg | ORAL_CAPSULE | Freq: Four times a day (QID) | ORAL | Status: DC | PRN
Start: 2021-12-04 — End: 2021-12-06

## 2021-12-04 MED ORDER — PRENATAL MULTIVITAMIN CH
1.0000 | ORAL_TABLET | Freq: Every day | ORAL | Status: DC
  Administered 2021-12-05: 1 via ORAL
  Filled 2021-12-04: qty 1

## 2021-12-04 MED ORDER — ONDANSETRON HCL 4 MG PO TABS: 4.0000 mg | ORAL_TABLET | ORAL | Status: DC | PRN

## 2021-12-04 MED ORDER — TRANEXAMIC ACID-NACL 1000-0.7 MG/100ML-% IV SOLN
INTRAVENOUS | Status: AC
  Filled 2021-12-04: qty 100

## 2021-12-04 MED ORDER — SODIUM CHLORIDE 0.9 % IV SOLN
500.0000 mg | Freq: Once | INTRAVENOUS | Status: AC
  Administered 2021-12-05: 500 mg via INTRAVENOUS
  Filled 2021-12-04: qty 25

## 2021-12-04 MED ORDER — OXYTOCIN-SODIUM CHLORIDE 30-0.9 UT/500ML-% IV SOLN
2.5000 [IU]/h | INTRAVENOUS | Status: DC
  Filled 2021-12-04: qty 500

## 2021-12-04 MED ORDER — OXYTOCIN BOLUS FROM INFUSION
333.0000 mL | Freq: Once | INTRAVENOUS | Status: AC
  Administered 2021-12-04: 333 mL via INTRAVENOUS

## 2021-12-04 MED ORDER — SENNOSIDES-DOCUSATE SODIUM 8.6-50 MG PO TABS
2.0000 | ORAL_TABLET | Freq: Every day | ORAL | Status: DC
  Administered 2021-12-06: 2 via ORAL
  Filled 2021-12-04: qty 2

## 2021-12-04 MED ORDER — OXYCODONE-ACETAMINOPHEN 5-325 MG PO TABS: 2.0000 | ORAL_TABLET | ORAL | Status: DC | PRN

## 2021-12-04 MED ORDER — DIPHENOXYLATE-ATROPINE 2.5-0.025 MG PO TABS
1.0000 | ORAL_TABLET | Freq: Once | ORAL | Status: AC
  Administered 2021-12-04: 1 via ORAL
  Filled 2021-12-04: qty 1

## 2021-12-04 MED ORDER — TETANUS-DIPHTH-ACELL PERTUSSIS 5-2.5-18.5 LF-MCG/0.5 IM SUSY: 0.5000 mL | PREFILLED_SYRINGE | Freq: Once | INTRAMUSCULAR | Status: DC

## 2021-12-04 MED ORDER — DIPHENHYDRAMINE HCL 50 MG/ML IJ SOLN: 12.5000 mg | INTRAMUSCULAR | Status: DC | PRN

## 2021-12-04 MED ORDER — ONDANSETRON HCL 4 MG/2ML IJ SOLN: 4.0000 mg | INTRAMUSCULAR | Status: DC | PRN

## 2021-12-04 MED ORDER — MORPHINE SULFATE (PF) 0.5 MG/ML IJ SOLN
INTRAMUSCULAR | Status: AC
  Filled 2021-12-04: qty 10

## 2021-12-04 MED ORDER — BENZOCAINE-MENTHOL 20-0.5 % EX AERO
1.0000 "application " | INHALATION_SPRAY | CUTANEOUS | Status: DC | PRN
  Administered 2021-12-05: 1 via TOPICAL
  Filled 2021-12-04: qty 56

## 2021-12-04 MED ORDER — LACTATED RINGERS IV SOLN: 500.0000 mL | Freq: Once | INTRAVENOUS | Status: DC

## 2021-12-04 MED ORDER — OXYTOCIN-SODIUM CHLORIDE 30-0.9 UT/500ML-% IV SOLN
INTRAVENOUS | Status: AC
  Filled 2021-12-04: qty 500

## 2021-12-04 MED ORDER — ONDANSETRON HCL 4 MG/2ML IJ SOLN
INTRAMUSCULAR | Status: AC
  Filled 2021-12-04: qty 2

## 2021-12-04 MED ORDER — FENTANYL CITRATE (PF) 100 MCG/2ML IJ SOLN
INTRAMUSCULAR | Status: AC
  Administered 2021-12-04: 100 ug via INTRAVENOUS
  Filled 2021-12-04: qty 4

## 2021-12-04 MED ORDER — IBUPROFEN 600 MG PO TABS
600.0000 mg | ORAL_TABLET | Freq: Four times a day (QID) | ORAL | Status: DC
  Administered 2021-12-04 – 2021-12-05 (×4): 600 mg via ORAL
  Filled 2021-12-04 (×5): qty 1

## 2021-12-04 MED ORDER — MISOPROSTOL 200 MCG PO TABS
ORAL_TABLET | ORAL | Status: AC
  Administered 2021-12-04: 1000 ug via RECTAL
  Filled 2021-12-04: qty 5

## 2021-12-04 MED ORDER — ZOLPIDEM TARTRATE 5 MG PO TABS: 5.0000 mg | ORAL_TABLET | Freq: Every evening | ORAL | Status: DC | PRN

## 2021-12-04 MED ORDER — LACTATED RINGERS IV SOLN: 500.0000 mL | INTRAVENOUS | Status: DC | PRN

## 2021-12-04 MED ORDER — ONDANSETRON HCL 4 MG/2ML IJ SOLN: 4.0000 mg | Freq: Four times a day (QID) | INTRAMUSCULAR | Status: DC | PRN

## 2021-12-04 MED ORDER — WITCH HAZEL-GLYCERIN EX PADS
1.0000 | MEDICATED_PAD | CUTANEOUS | Status: DC | PRN
Start: 2021-12-04 — End: 2021-12-06

## 2021-12-04 MED ORDER — PHENYLEPHRINE 40 MCG/ML (10ML) SYRINGE FOR IV PUSH (FOR BLOOD PRESSURE SUPPORT): 80.0000 ug | PREFILLED_SYRINGE | INTRAVENOUS | Status: DC | PRN

## 2021-12-04 MED ORDER — OXYTOCIN BOLUS FROM INFUSION: 333.0000 mL | Freq: Once | INTRAVENOUS | Status: DC

## 2021-12-04 MED ORDER — FENTANYL CITRATE (PF) 100 MCG/2ML IJ SOLN
INTRAMUSCULAR | Status: AC
  Filled 2021-12-04: qty 2

## 2021-12-04 MED ORDER — SODIUM CHLORIDE 0.9 % IV SOLN
3.0000 g | Freq: Once | INTRAVENOUS | Status: AC
  Administered 2021-12-04: 3 g via INTRAVENOUS
  Filled 2021-12-04: qty 8

## 2021-12-04 MED ORDER — OXYTOCIN-SODIUM CHLORIDE 30-0.9 UT/500ML-% IV SOLN: 1.0000 m[IU]/min | INTRAVENOUS | Status: DC

## 2021-12-04 MED ORDER — LACTATED RINGERS IV SOLN
500.0000 mL | INTRAVENOUS | Status: DC | PRN
  Administered 2021-12-04: 500 mL via INTRAVENOUS

## 2021-12-04 MED ORDER — AMMONIA AROMATIC IN INHA
RESPIRATORY_TRACT | Status: AC
  Filled 2021-12-04: qty 10

## 2021-12-04 MED ORDER — SIMETHICONE 80 MG PO CHEW: 80.0000 mg | CHEWABLE_TABLET | ORAL | Status: DC | PRN

## 2021-12-04 MED ORDER — TERBUTALINE SULFATE 1 MG/ML IJ SOLN
0.2500 mg | Freq: Once | INTRAMUSCULAR | Status: AC | PRN
  Administered 2021-12-04: 0.25 mg via SUBCUTANEOUS
  Filled 2021-12-04: qty 1

## 2021-12-04 MED ORDER — METHYLERGONOVINE MALEATE 0.2 MG/ML IJ SOLN
INTRAMUSCULAR | Status: AC
  Administered 2021-12-04: 0.2 mg
  Filled 2021-12-04: qty 3

## 2021-12-04 MED ORDER — CARBOPROST TROMETHAMINE 250 MCG/ML IM SOLN
INTRAMUSCULAR | Status: AC
  Filled 2021-12-04: qty 1

## 2021-12-04 NOTE — Progress Notes (Signed)
Subjective: Postpartum Day # 0 : S/P NSVD due to pt was admitted on on 2/11 for labor with, was augmented with AROM progressed to SVD on 2/11 @ 1735, over 1st degree hemostatic repaired for aesthetics, ebl at time of delivery was 112mls, pt U was firm at umbilicus. Was called to [facility] room by RN for PPH, EBL was 2500 total, DR Dillard called for bedside assessment. Pt was given TXA, 1027mcg rectal cytotec, methergine, Hemabate with lamotil, 1L LR bolus, at was up to the bathroom prior to [facility] and urinated 336mls, Pt required x2 manual sweeps, 3gm Unasyn hung x1 dose. PP methergine PO ordered for next 24 hours. Pt had been diaphoretic and dizzy during PPH, post pt has more color and feels better. Starting hjgb was 12.4 with post hemorrhage was 9.4, IV iron 500 ordered.   Objective: Vital signs in last 24 hours: Patient Vitals for the past 24 hrs:  BP Temp Temp src Pulse Resp SpO2 Height Weight  12/04/21 1957 96/68 -- -- 89 18 97 % -- --  12/04/21 1903 112/61 -- -- 79 16 -- -- --  12/04/21 1832 113/70 -- -- 81 17 -- -- --  12/04/21 1816 (!) 111/95 -- -- (!) 126 16 -- -- --  12/04/21 1801 117/66 -- -- 90 -- -- -- --  12/04/21 1750 (!) 117/93 -- -- (!) 118 -- -- -- --  12/04/21 1700 103/62 -- -- 92 17 99 % -- --  12/04/21 1633 109/64 -- -- 100 16 -- -- --  12/04/21 1550 -- 97.7 F (36.5 C) Oral -- -- -- -- --  12/04/21 1505 (!) 113/53 -- -- (!) 109 -- 100 % -- --  12/04/21 1501 (!) 119/57 -- -- 100 -- 100 % -- --  12/04/21 1500 (!) 119/57 -- -- (!) 105 -- 100 % -- --  12/04/21 1457 (!) 123/102 -- -- (!) 105 -- 100 % -- --  12/04/21 1456 (!) 123/102 -- -- (!) 193 -- 99 % -- --  12/04/21 1455 (!) 123/102 -- -- (!) 193 -- 99 % -- --  12/04/21 1451 118/66 -- -- (!) 108 -- 99 % -- --  12/04/21 1450 118/66 -- -- 99 17 98 % -- --  12/04/21 1445 98/72 -- -- (!) 107 -- 99 % -- --  12/04/21 1442 139/80 -- -- 100 -- 95 % -- --  12/04/21 1401 -- -- -- -- -- 98 % -- --  12/04/21 1330 139/88 -- -- 80 -- -- --  --  12/04/21 1235 130/87 -- -- 84 16 -- -- --  12/04/21 1134 110/68 97.9 F (36.6 C) Oral 74 15 -- -- --  12/04/21 0620 107/69 -- -- 84 -- -- -- --  12/04/21 0602 116/85 (!) 97.5 F (36.4 C) Oral 94 18 -- 5\' 9"  (1.753 m) 81 kg     Physical Exam:  General: pale Mood/Affect: flat Lungs: clear to auscultation, no wheezes, rales or rhonchi, symmetric air entry.  Heart: normal rate, regular rhythm, normal S1, S2, no murmurs, rubs, clicks or gallops. Breast: breasts appear normal, no suspicious masses, no skin or nipple changes or axillary nodes. Abdomen:  + bowel sounds, soft, non-tender GU: perineum approximate, healing well. No signs of external hematomas.  Uterine Fundus: at umbilicus post hemorrhage.  Lochia: appropriate Skin: Warm, Dry. DVT Evaluation: No evidence of DVT seen on physical exam. Negative Homan's sign. No cords or calf tenderness. No significant calf/ankle edema.  CBC Latest Ref Rng & Units  12/04/2021 12/04/2021 01/28/2021  WBC 4.0 - 10.5 K/uL 22.2(H) 16.8(H) 10.4  Hemoglobin 12.0 - 15.0 g/dL 9.4(L) 12.5 14.1  Hematocrit 36.0 - 46.0 % 27.5(L) 37.0 42.5  Platelets 150 - 400 K/uL 182 178 265    Results for orders placed or performed during the hospital encounter of 12/04/21 (from the past 24 hour(s))  CBC     Status: Abnormal   Collection Time: 12/04/21 11:15 AM  Result Value Ref Range   WBC 16.8 (H) 4.0 - 10.5 K/uL   RBC 4.26 3.87 - 5.11 MIL/uL   Hemoglobin 12.5 12.0 - 15.0 g/dL   HCT 37.0 36.0 - 46.0 %   MCV 86.9 80.0 - 100.0 fL   MCH 29.3 26.0 - 34.0 pg   MCHC 33.8 30.0 - 36.0 g/dL   RDW 13.7 11.5 - 15.5 %   Platelets 178 150 - 400 K/uL   nRBC 0.0 0.0 - 0.2 %  Type and screen [place]     Status: None   Collection Time: 12/04/21 11:15 AM  Result Value Ref Range   ABO/RH(D) B POS    Antibody Screen NEG    Sample Expiration      12/07/2021,2359 Performed at [facility], [place] [address], [place], [place] [address]   Resp Panel  by RT-PCR (Flu A&B, Covid) Nasopharyngeal Swab     Status: None   Collection Time: 12/04/21 11:15 AM   Specimen: Nasopharyngeal Swab; Nasopharyngeal(NP) swabs in vial transport medium  Result Value Ref Range   SARS Coronavirus 2 by RT PCR NEGATIVE NEGATIVE   Influenza A by PCR NEGATIVE NEGATIVE   Influenza B by PCR NEGATIVE NEGATIVE  CBC     Status: Abnormal   Collection Time: 12/04/21  8:27 PM  Result Value Ref Range   WBC 22.2 (H) 4.0 - 10.5 K/uL   RBC 3.19 (L) 3.87 - 5.11 MIL/uL   Hemoglobin 9.4 (L) 12.0 - 15.0 g/dL   HCT 27.5 (L) 36.0 - 46.0 %   MCV 86.2 80.0 - 100.0 fL   MCH 29.5 26.0 - 34.0 pg   MCHC 34.2 30.0 - 36.0 g/dL   RDW 13.4 11.5 - 15.5 %   Platelets 182 150 - 400 K/uL   nRBC 0.0 0.0 - 0.2 %     CBG (last 3)  No results for input(s): GLUCAP in the last 72 hours.   I/O last 3 completed shifts: In: -  Out: 450 [Urine:300; Blood:150]   Assessment Postpartum Day # 0 : S/P NSVD due to pt was admitted on on 2/11 for labor with, was augmented with AROM progressed to SVD on 2/11 @ 1735, over 1st degree hemostatic repaired for aesthetics, ebl at time of delivery was 122mls, pt U was firm at umbilicus. Was called to [facility] room by RN for PPH, EBL was 2500 total, DR Dillard called for bedside assessment. Pt was given TXA, 1050mcg rectal cytotec, methergine, Hemabate with lamotil, 1L LR bolus, at was up to the bathroom prior to [facility] and urinated 363mls, Pt required x2 manual sweeps, 3gm Unasyn hung x1 dose. PP methergine PO ordered for next 24 hours. Pt had been diaphoretic and dizzy during PPH, post pt has more color and feels better. Starting hjgb was 12.4 with post hemorrhage was 9.4, IV iron 500 ordered.  Pt stable. Hemodynamically stable.   Plan: Continue other mgmt as ordered VTE prophylactics: Early ambulated as tolerates.  Pain control: Motrin/Tylenol PRN Foley in place. Repeat CBC in the  morning IV venofer 500 now. PO methergine @6  hours for 24 hours Unasyn 3gm x1 dose  now.  Orthostatic in the morning   Dr. Charlesetta Garibaldi tat bedside assessing.   [facility] NP-C, CNM 12/04/2021, 8:56 PM

## 2021-12-04 NOTE — Lactation Note (Addendum)
This note was copied from a baby's chart. Lactation Consultation Note  Patient Name: Margaret [name] Date: 12/04/2021   Age:30 hours  LC went to assist with breastfeeding. RN, Huston Foley Arminger helping Mom with PPH. LC to return when mother is stable.   LC called RN to see if Mother stable for a visit with lactation. Mom will like to be seen in the morning.   Maternal Data    Feeding    LATCH Score Latch: Grasps breast easily, tongue down, lips flanged, rhythmical sucking.  Audible Swallowing: Spontaneous and intermittent  Type of Nipple: Everted at rest and after stimulation  Comfort (Breast/Nipple): Soft / non-tender  Hold (Positioning): Assistance needed to correctly position infant at breast and maintain latch.  LATCH Score: 9   Lactation Tools Discussed/Used    Interventions    Discharge    Consult Status      Margaret [name]  Margaret [name] 12/04/2021, 8:10 PM

## 2021-12-04 NOTE — Anesthesia Preprocedure Evaluation (Deleted)
Anesthesia Evaluation  °Patient identified by MRN, date of birth, ID band °Patient awake ° °General Assessment Comment:Mother had difficulty with anesthesia with 3rd molar extraction ° °Reviewed: °Allergy & Precautions, NPO status , Patient's Chart, lab work & pertinent test results ° °History of Anesthesia Complications °(+) Family history of anesthesia reaction ° °Airway °Mallampati: III ° °TM Distance: >3 FB °Neck ROM: Full ° ° ° Dental °no notable dental hx. °(+) Teeth Intact, Dental Advisory Given °  °Pulmonary °neg pulmonary ROS,  °  °Pulmonary exam normal °breath sounds clear to auscultation ° ° ° ° ° ° Cardiovascular °negative cardio ROS °Normal cardiovascular exam °Rhythm:Regular Rate:Normal ° ° °  °Neuro/Psych °negative neurological ROS ° negative psych ROS  ° GI/Hepatic °negative GI ROS, Neg liver ROS,   °Endo/Other  °negative endocrine ROS ° Renal/GU °negative Renal ROS  °negative genitourinary °  °Musculoskeletal °negative musculoskeletal ROS °(+)  ° Abdominal °  °Peds ° Hematology °negative hematology ROS °(+)   °Anesthesia Other Findings ° ° Reproductive/Obstetrics °(+) Pregnancy °Non Reassuring FHR tracing ° °  ° ° ° ° ° ° ° ° ° ° ° ° ° °  °  ° ° ° ° ° ° ° ° °Anesthesia Physical ° °Anesthesia Plan ° °ASA: 2 ° °Anesthesia Plan: Spinal  ° °Post-op Pain Management:   ° °Induction:  ° °PONV Risk Score and Plan: 4 or greater and Treatment may vary due to age or medical condition ° °Airway Management Planned: Natural Airway ° °Additional Equipment:  ° °Intra-op Plan:  ° °Post-operative Plan:  ° °Informed Consent: I have reviewed the patients History and Physical, chart, labs and discussed the procedure including the risks, benefits and alternatives for the proposed anesthesia with the patient or authorized representative who has indicated his/her understanding and acceptance.  ° ° ° ° ° °Plan Discussed with: Anesthesiologist and CRNA ° °Anesthesia Plan Comments:    ° ° ° ° ° ° °Anesthesia Quick Evaluation ° °

## 2021-12-04 NOTE — Anesthesia Procedure Notes (Signed)
Epidural Patient location during procedure: OB Start time: 12/04/2021 2:39 PM End time: 12/04/2021 2:46 PM  Staffing Anesthesiologist: Mal Amabile, MD Performed: anesthesiologist   Preanesthetic Checklist Completed: patient identified, IV checked, site marked, risks and benefits discussed, surgical consent, monitors and equipment checked, pre-op evaluation and timeout performed  Epidural Patient position: sitting Prep: DuraPrep and site prepped and draped Patient monitoring: continuous pulse ox and blood pressure Approach: midline Location: L3-L4 Injection technique: LOR air  Needle:  Needle type: Tuohy  Needle gauge: 17 G Needle length: 9 cm and 9 Needle insertion depth: 4 cm Catheter type: closed end flexible Catheter size: 19 Gauge Catheter at skin depth: 9 cm Test dose: negative and Other  Assessment Events: blood not aspirated, injection not painful, no injection resistance, no paresthesia and negative IV test  Additional Notes Patient identified. Risks and benefits discussed including failed block, incomplete  Pain control, post dural puncture headache, nerve damage, paralysis, blood pressure Changes, nausea, vomiting, reactions to medications-both toxic and allergic and post Partum back pain. All questions were answered. Patient expressed understanding and wished to proceed. Sterile technique was used throughout procedure. Epidural site was Dressed with sterile barrier dressing. No paresthesias, signs of intravascular injection Or signs of intrathecal spread were encountered.  Patient was more comfortable after the epidural was dosed. Please see RN's note for documentation of vital signs and FHR which are stable. Reason for block:procedure for pain

## 2021-12-04 NOTE — Progress Notes (Signed)
Labor Progress Note  Margaret [name] is a 31 y.o. female, G2P1001, IUP at 40.2 weeks, presenting for pt was having painful cxt, pt made no cervical change in MAU has remained stable at 3cm over last four hour, pt verbalized desire to stay and ha an elective IOL. H/O bilat breast augmentation, and H/O PPH. GBS-.   Subjective: Pt in bed resting feeling cxt more intense now, when pt was admitted to LD pt started having a Cat 2 strip, prolonged decel noted at long to nadir of 90s, then return to baseline of 135, after that a couple lates were noted, pt was not laboring and cxt spaced to every 5-6 mins, no cervical change noted at 1148 another proloned decel noted to nadir of 90s for 4 mins, then return to baseline, then catr 1 strip for 20 mins, AROM , clear fluid, scant amount, with bloody show noted, continue with Cat 1 strip until 1317 prolonged decel noted to nadir 90s, with return, occ varible noted, called into the room at 1352 for prolonged decel  for 6 mins to nadir of 70-90s, DR Dillard called and made aware at 1408, cxt Q2-3 mins, terbutaline given, FSE placed, Dr Normand Sloop en route to assess at bedside, RN called faculty, Dr Donavan Foil was then present at bedside as we continued to assess and reposition change, 1L NS infusing, strip returned to 120 baseline but then dipped for anohter 8 mins to nadir of 70s, with good variability the whole time. Dr Normand Sloop then in room to assess 1432, dr Donavan Foil left, Cat 1 strip started, pt desired epidural , dr foster and dr dillard in room for epidural placement, pt tolerated well.  Patient Active Problem List   Diagnosis Date Noted   Encounter for induction of labor 12/04/2021   Post-dates pregnancy 12/04/2021   Pregnancy 05/26/2019   Objective: BP 109/64    Pulse 100    Temp 97.7 F (36.5 C) (Oral)    Resp 16    Ht 5\' 9"  (1.753 m)    Wt 81 kg    LMP 12/13/2020    SpO2 100%    BMI 26.36 kg/m  No intake/output data recorded. No intake/output data  recorded. NST: FHR baseline 130 bpm, Variability: moderate, Accelerations:present, Decelerations:  Present  variables and prolonged decels = Cat 1-2/Reactive. Reactive CTX:  irregular, every 2-5 minutes Uterus gravid, soft non tender, moderate to palpate with contractions.  SVE:  Dilation: 7 Effacement (%): 80 Station: 0 Exam by:: Dr. [email or web address]  Assessment:  Margaret [name] is a 31 y.o. female, G2P1001, IUP at 40.2 weeks, presenting for pt was having painful cxt, pt made no cervical change in MAU has remained stable at 3cm over last four hour, pt verbalized desire to stay and ha an elective IOL. H/O bilat breast augmentation, and H/O PPH. GBS-.  Patient Active Problem List   Diagnosis Date Noted   Encounter for induction of labor 12/04/2021   Post-dates pregnancy 12/04/2021   Pregnancy 05/26/2019   NICHD: Category 2 Category 2 with active intrauterine fluid bolus and maternal position change resuscitative measures   Membranes: AROM, clear with bloody show @1230  on 2/11, no s/s of infection  Induction:    Cytotec xno  Foley Bulb: no  Pitocin - no  Pain management:               IV pain management: x ORN  Nitrous: PRN             Epidural placement:  at 1457 on 2/11  GBS Negative    Plan: Continue labor plan Continuous monitoring Rest Frequent position changes to facilitate fetal rotation and descent. Will reassess with cervical exam at 4 hours or earlier if necessary Expectant management.  Anticipate labor progression and vaginal delivery.   Md Dillard aware of plan and verbalized agreement.   Dale [place], NP-C, CNM, MSN 12/04/2021. 4:41 PM

## 2021-12-04 NOTE — MAU Note (Signed)
PT SAYS UC STRONG [facility] FOR INDUCTION 2-16 PNC WITH [facility] WAS SEEN ON Thursday   VE - 2 CM

## 2021-12-04 NOTE — Progress Notes (Signed)
Upon transfer to mother baby unit, patient stated she felt a gush of blood. This RN assisted patient to the toilet per her request to urinate, patient urinated and a gush of blood went into the toilet. Approximately of blood. Patient was assisted back to the bed where a fundal rub was initiated. Fundus appeared to be boggy and more blood & clots were expelled during the fundal rub- Trition calculated at this rub.   Labor and delivery Rapid Response was called to bedside for assistance along with [facility], CNM. Jade ordered medications to help with the bleeding. Patient was feeling light headed, dizzy and clammy.   BP was 96/68 with a HR of 98 at 1957.  [facility] CNM in department, on the way to bedside. PPH was called.  Dr Normand Sloop was notified per [facility], CNM's request. Normand Sloop, MD notified at 2012 and at bedside at 2035. Foley catheter was placed per Dillard MD. Bleeding slowed down at this point, fundus is firm and at umbilicus. An additional  was weighed from the blood on the pads, bed sheets, chux pads, etc.   Patient was vomiting, zofran was ordered by [facility], CNM and given. Patient stable at this time, asymptomatic.  Husband is at bedside, supportive.   blood loss at delivery blood loss from first fundal rub after transport to mother baby unit.  Estimated in toilet  [place] weighed 1729 from bed sheets, chux pads, etc.   Total blood loss 2588    [place] RN

## 2021-12-04 NOTE — Anesthesia Preprocedure Evaluation (Addendum)
Anesthesia Evaluation  Patient identified by MRN, date of birth, ID band Patient awake  General Assessment Comment:Mother had difficulty with anesthesia with 3rd molar extraction  Reviewed: Allergy & Precautions, NPO status , Patient's Chart, lab work & pertinent test results  History of Anesthesia Complications (+) Family history of anesthesia reaction  Airway Mallampati: III  TM Distance: >3 FB Neck ROM: Full    Dental no notable dental hx. (+) Teeth Intact, Dental Advisory Given   Pulmonary neg pulmonary ROS,    Pulmonary exam normal breath sounds clear to auscultation       Cardiovascular negative cardio ROS Normal cardiovascular exam Rhythm:Regular Rate:Normal     Neuro/Psych negative neurological ROS  negative psych ROS   GI/Hepatic negative GI ROS, Neg liver ROS,   Endo/Other  negative endocrine ROS  Renal/GU negative Renal ROS  negative genitourinary   Musculoskeletal negative musculoskeletal ROS (+)   Abdominal   Peds  Hematology negative hematology ROS (+)   Anesthesia Other Findings   Reproductive/Obstetrics (+) Pregnancy Non Reassuring FHR tracing                             Anesthesia Physical  Anesthesia Plan  ASA: 2  Anesthesia Plan: Spinal   Post-op Pain Management:    Induction:   PONV Risk Score and Plan: 4 or greater and Treatment may vary due to age or medical condition  Airway Management Planned: Natural Airway  Additional Equipment:   Intra-op Plan:   Post-operative Plan:   Informed Consent: I have reviewed the patients History and Physical, chart, labs and discussed the procedure including the risks, benefits and alternatives for the proposed anesthesia with the patient or authorized representative who has indicated his/her understanding and acceptance.       Plan Discussed with: Anesthesiologist and CRNA  Anesthesia Plan Comments:          Anesthesia Quick Evaluation

## 2021-12-04 NOTE — H&P (Signed)
Margaret [name] is a 31 y.o. female, G2P1001, IUP at 40.2 weeks, presenting for pt was having painful cxt, pt made no cervical change in MAU has remained stable at 3cm over last four hour, pt verbalized desire to stay and ha an elective IOL. H/O bilat breast augmentation, and H/O PPH. GBS-. Pt endorse + Fm. Denies vaginal leakage. Denies vaginal bleeding.  Patient Active Problem List   Diagnosis Date Noted   Encounter for induction of labor 12/04/2021   Post-dates pregnancy 12/04/2021   Pregnancy 05/26/2019     Active Ambulatory Problems    Diagnosis Date Noted   Pregnancy 05/26/2019   Resolved Ambulatory Problems    Diagnosis Date Noted   No Resolved Ambulatory Problems   Past Medical History:  Diagnosis Date   Family history of adverse reaction to anesthesia       Medications Prior to Admission  Medication Sig Dispense Refill Last Dose   ferrous sulfate 325 (65 FE) MG tablet Take 325 mg by mouth daily with breakfast.   12/03/2021   Prenatal Vit-Fe Fumarate-FA (MULTIVITAMIN-PRENATAL) 27-0.8 MG TABS tablet Take 1 tablet by mouth daily at 12 noon.   12/03/2021    Past Medical History:  Diagnosis Date   Family history of adverse reaction to anesthesia      No current facility-administered medications on file prior to encounter.   Current Outpatient Medications on File Prior to Encounter  Medication Sig Dispense Refill   ferrous sulfate 325 (65 FE) MG tablet Take 325 mg by mouth daily with breakfast.     Prenatal Vit-Fe Fumarate-FA (MULTIVITAMIN-PRENATAL) 27-0.8 MG TABS tablet Take 1 tablet by mouth daily at 12 noon.       No Known Allergies  History of present pregnancy: Pt Info/Preference:  Screening/Consents:  Labs:   EDD: Estimated Date of Delivery: 12/02/21  Establised: Patient's last menstrual period was 12/13/2020.  Anatomy Scan: Date: 9/23 Placenta Location: anterior Genetic Screen: Panoroma:declined AFP:  First Tri: Quad:  Office: [facility]            First PNV: 12 weeks  Blood Type --/--/B POS (02/11 1115)  Language: english Last PNV: 39.1 weeks Rhogam    Flu Vaccine:  declined   Antibody NEG (02/11 1115)  TDaP vaccine UTD   GTT: Early: 4.8 Third Trimester: 109  Feeding Plan: breast BTL: no Rubella: Immune (07/13 0000)  Contraception: ??? VBAC: no RPR: Nonreactive (07/13 0000)   Circumcision: In pt desired   HBsAg: Negative (07/13 0000)  Pediatrician:  ???   HIV: Non-reactive (07/13 0000)   Prenatal Classes: no Additional [place]: 12/27 growth 4.14 37%, vertex, anterior, afi 13.8 GBS: Negative, Negative/-- (01/19 0000)(For PCN allergy, check sensitivities)       Chlamydia: neg    MFM Referral/Consult:  GC: neg  Support Person: husband   PAP: ???  Pain Management: Natural vs epidural Neonatologist Referral:  Hgb Electrophoresis:  AA  Birth Plan: DCC   Hgb NOB: 14.7    28W: 12.6   OB History     Gravida  2   Para  1   Term  1   Preterm      AB      Living  1      SAB      IAB      Ectopic      Multiple  0   Live Births  1          Past Medical History:  Diagnosis Date   Family  history of adverse reaction to anesthesia    Past Surgical History:  Procedure Laterality Date   BREAST ENHANCEMENT SURGERY  2018   BREAST SURGERY     Family History: family history includes Cancer in her maternal grandmother; Diabetes in her mother. Social History:  reports that she has never smoked. She has never used smokeless tobacco. She reports that she does not drink alcohol and does not use drugs.   Prenatal Transfer Tool  Maternal Diabetes: No Genetic Screening: Normal Maternal Ultrasounds/Referrals: Normal Fetal Ultrasounds or other Referrals:  None Maternal Substance Abuse:  No Significant Maternal Medications:  None Significant Maternal Lab Results: Group B Strep negative  ROS:  Review of Systems  Constitutional: Negative.   HENT: Negative.    Eyes: Negative.   Respiratory: Negative.    Cardiovascular: Negative.   Gastrointestinal:   Positive for abdominal pain.  Genitourinary:        Uterus gravida equal to dates, pelvis adequate for vaginal delivery   Musculoskeletal: Negative.   Skin: Negative.   Neurological: Negative.   Endo/Heme/Allergies: Negative.   Psychiatric/Behavioral: Negative.      Physical Exam: BP 139/88 (BP Location: Right Arm)    Pulse 80    Temp 97.9 F (36.6 C) (Oral)    Resp 16    Ht 5\' 9"  (1.753 m)    Wt 81 kg    LMP 12/13/2020    BMI 26.36 kg/m   Physical Exam Vitals and nursing note reviewed.  Constitutional:      Appearance: Normal appearance.  HENT:     Head: Normocephalic and atraumatic.     Nose: Nose normal.     Mouth/Throat:     Mouth: Mucous membranes are moist.  Eyes:     Pupils: Pupils are equal, round, and reactive to light.  Cardiovascular:     Rate and Rhythm: Normal rate.     Pulses: Normal pulses.  Pulmonary:     Effort: Pulmonary effort is normal.  Abdominal:     General: Bowel sounds are normal.  Genitourinary:    Comments: Uterus gravida , pelvis adequate for vaginal delivery  Musculoskeletal:        General: Normal range of motion.     Cervical back: Normal range of motion.  Skin:    General: Skin is warm.     Capillary Refill: Capillary refill takes less than 2 seconds.  Neurological:     General: No focal deficit present.     Mental Status: She is alert.  Psychiatric:        Mood and Affect: Mood normal.     NST: FHR baseline 135 bpm, Variability: moderate, Accelerations:present, Decelerations:  Absent= Cat 1/Reactive UC:   irregular, every 2-5 minutes SVE:   Dilation: 4 Effacement (%): 80 Station: -1 Exam by:: [facility] CNM, vertex verified by fetal sutures.  Leopold's: Position vertex, EFW 7lbs via leopold's.   Labs: Results for orders placed or performed during the hospital encounter of 12/04/21 (from the past 24 hour(s))  CBC     Status: Abnormal   Collection Time: 12/04/21 11:15 AM  Result Value Ref Range   WBC 16.8 (H) 4.0 - 10.5 K/uL    RBC 4.26 3.87 - 5.11 MIL/uL   Hemoglobin 12.5 12.0 - 15.0 g/dL   HCT 56.3 89.3 - 73.4 %   MCV 86.9 80.0 - 100.0 fL   MCH 29.3 26.0 - 34.0 pg   MCHC 33.8 30.0 - 36.0 g/dL   RDW 28.7 68.1 -  15.5 %   Platelets 178 150 - 400 K/uL   nRBC 0.0 0.0 - 0.2 %  Type and screen [place]     Status: None   Collection Time: 12/04/21 11:15 AM  Result Value Ref Range   ABO/RH(D) B POS    Antibody Screen NEG    Sample Expiration      12/07/2021,2359 Performed at [facility], [place] [address] [place], [place] [address]   Resp Panel by RT-PCR (Flu A&B, Covid) Nasopharyngeal Swab     Status: None   Collection Time: 12/04/21 11:15 AM   Specimen: Nasopharyngeal Swab; Nasopharyngeal(NP) swabs in vial transport medium  Result Value Ref Range   SARS Coronavirus 2 by RT PCR NEGATIVE NEGATIVE   Influenza A by PCR NEGATIVE NEGATIVE   Influenza B by PCR NEGATIVE NEGATIVE    Imaging:  No results found.  MAU Course: Orders Placed This Encounter  Procedures   Resp Panel by RT-PCR (Flu A&B, Covid) Nasopharyngeal Swab   CBC   RPR   Diet clear liquid Room service appropriate? Yes; Fluid consistency: Thin   LABOR:  When conractions begin, you should start to time them from the beginning of one contraction to the beginning  of the next.  When contractions are 5 - 10 minutes apart or less and have been regular for at least an hour, you should call your health care provider.   Notify physician for bleeding from the vagina   Notify physician for pain or burning when urinating   Notify physician for chills or fever   Notify physician for increase in vaginal discharge   Notify physician for pelvic pressure (sudden increase)   Notify physician if baby moving less than usual   Notify physician for sudden, constant, or occasional abdominal pain   Notify physician for sudden gushing of fluid from the vagina (with or without continued leaking)   Notify physician for leaking of fluid    Notify physician for fainting spells, "black outs" or loss of consciousness   Notify physician for severe or continued nausea or vomiting   Notify physician for blurring of vision or spots before the eyes   Fetal Kick Count:  Lie on our left side for one hour after a meal, and count the number of times your baby kicks.  If it is less than 5 times, get up, move around and drink some juice.  Repeat the test 30 minutes later.  If it is still less than 5 kicks in an hour, notify your doctor.   Vitals signs per unit policy   Notify physician (specify)   Fetal monitoring per unit policy   Activity as tolerated   Cervical Exam   Measure blood pressure post delivery every 15 min x 1 hour then every 30 min x 1 hour   Fundal check post delivery every 15 min x 1 hour then every 30 min x 1 hour   If Rapid HIV test positive or known HIV positive: initiate AZT orders   May in and out cath x 2 for inability to void   Insert urethral catheter X 1 PRN If Coude Catheter is chosen, qualified resources by campus can be found in the clinical skills nursing procedure for Coude Catheter 1. If straight catheterized > 2 times or patient unable to void post epidural plac...   Refer to Sidebar Report Urinary (Foley) Catheter Indications   Refer to Sidebar Report Post Indwelling Urinary Catheter Removal and Intervention Guidelines  Discontinue foley prior to vaginal delivery   Initiate Carrier Fluid Protocol   Initiate Oral Care Protocol   Patient may have epidural placement upon request   Evaluate fetal heart rate to establish reassuring pattern prior to initiating Cytotec or Pitocin   Perform a cervical exam prior to initiating Cytotec or Pitocin   Discontinue Pitocin if tachysystole with non-reassuring FHR is present   Nofify MD/CNM if tachysystole with non-reassuring FHR is present   Initiate intrauterine resuscitation if tachysystole with non-reasuring FHR is present   If tachysystole WITH reassuring FHR present  notify MD / CNM   May administer Terbutaline 0.25 mg SQ x 1 dose if tachysystole with non-reassuring FHR is presesnt   Labor Induction   Vitals signs per unit policy   Notify physician (specify)   Fetal monitoring per unit policy   Activity as tolerated   Cervical Exam   Measure blood pressure post delivery every 15 min x 1 hour then every 30 min x 1 hour   Fundal check post delivery every 15 min x 1 hour then every 30 min x 1 hour   If Rapid HIV test positive or known HIV positive: initiate AZT orders   May in and out cath x 2 for inability to void   Insert urethral catheter X 1 PRN If Coude Catheter is chosen, qualified resources by campus can be found in the clinical skills nursing procedure for Coude Catheter 1. If straight catheterized > 2 times or patient unable to void post epidural plac...   Refer to Sidebar Report Urinary (Foley) Catheter Indications   Refer to Sidebar Report Post Indwelling Urinary Catheter Removal and Intervention Guidelines   Discontinue foley prior to vaginal delivery   Initiate Carrier Fluid Protocol   Initiate Oral Care Protocol   Patient may have epidural placement upon request   May use local infiltration of 1% lidocaine plain to produce a skin wheal prior to IV insertion   Notify in-house Anesthesia team of nausea and vomiting greater than 5 hours   Assess for signs/symptoms of PIH/preeclampsia   RN to place order for: CBC if one has not been drawn in the past 6 hours for all patients with hypertensive disease, pre-eclampsia, eclampsia, thrombocytopenia or previous PLTC<150,000.   Identify to Anesthesia if patient plans to have postpartum tubal ligation; do not remove epidural without discussion with Anesthesiologist   Vital signs following Epidural Placement, re-bolus or re-dose monitor patient's BP and oxygen saturation every 5 minutes for 30 minutes   Pain Assessment Document numeric pain score   RN to remain at bedside continuously for 30 minutes post  epidural placement, post re-bolus / re-dose   Notify Anesthesia if the patient becomes short of breath or complains of heaviness in chest, chest pain, and/or unrelieved pain   Notify Anesthesia prior to discontinuing epidural infusion   Full code   Nitrous Oxide 50%/Oxygen 50%   Type and screen [place] and maintain IV Line   Insert and maintain IV Line   Admit to Inpatient (patient's expected length of stay will be greater than 2 midnights or inpatient only procedure)   Admit to Inpatient (patient's expected length of stay will be greater than 2 midnights or inpatient only procedure)   Discharge patient Discharge disposition: 01-Home or Self Care; Discharge patient date: 12/04/2021   Meds ordered this encounter  Medications   DISCONTD: lactated ringers infusion   DISCONTD: oxytocin (PITOCIN) IV BOLUS FROM BAG   DISCONTD:  oxytocin (PITOCIN) IV infusion 30 units in NS 500 mL - Premix   DISCONTD: lactated ringers infusion 500-1,000 mL   DISCONTD: acetaminophen (TYLENOL) tablet 650 mg   oxyCODONE-acetaminophen (PERCOCET/ROXICET) 5-325 MG per tablet 1 tablet   oxyCODONE-acetaminophen (PERCOCET/ROXICET) 5-325 MG per tablet 2 tablet   DISCONTD: ondansetron (ZOFRAN) injection 4 mg   DISCONTD: sodium citrate-citric acid (ORACIT) solution 30 mL   DISCONTD: lidocaine (PF) (XYLOCAINE) 1 % injection 30 mL   fentaNYL (SUBLIMAZE) injection 50-100 mcg   terbutaline (BRETHINE) injection 0.25 mg   oxytocin (PITOCIN) IV infusion 30 units in NS 500 mL - Premix    Order Specific Question:   Begin infusion at:    Answer:   2 milli-units/min (2 mL/hr)    Order Specific Question:   Increase infusion by:    Answer:   2 milli-units/min (2 mL/hr)   lactated ringers infusion   oxytocin (PITOCIN) IV BOLUS FROM BAG   oxytocin (PITOCIN) IV infusion 30 units in NS 500 mL - Premix   lactated ringers infusion 500-1,000 mL   acetaminophen (TYLENOL) tablet 650 mg   ondansetron (ZOFRAN)  injection 4 mg   sodium citrate-citric acid (ORACIT) solution 30 mL   lidocaine (PF) (XYLOCAINE) 1 % injection 30 mL   ePHEDrine injection 10 mg   PHENYLephrine 40 mcg/ml in normal saline Adult IV Push Syringe (For Blood Pressure Support)   lactated ringers infusion 500 mL   fentaNYL 2 mcg/mL w/ bupivacaine 0.125% in NS 250 mL epidural infusion   diphenhydrAMINE (BENADRYL) injection 12.5 mg   ePHEDrine injection 10 mg   PHENYLephrine 40 mcg/ml in normal saline Adult IV Push Syringe (For Blood Pressure Support)    Assessment/Plan: HANG ORTEGA is a 31 y.o. female, G2P1001, IUP at 40.2 weeks, presenting for pt was having painful cxt, pt made no cervical change in MAU has remained stable at 3cm over last four hour, pt verbalized desire to stay and ha an elective IOL. H/O bilat breast augmentation, and H/O PPH. GBS-. Pt endorse + Fm. Denies vaginal leakage. Denies vaginal bleeding.  FWB: Cat 1 Fetal Tracing.   Plan: Admit to [place] per consult with Dr Charlesetta Garibaldi Routine [facility] orders Pain med/epidural prn Plan for AROM Anticipate labor progression   Noralyn Pick NP-C, CNM, MSN 12/04/2021, 1:34 PM

## 2021-12-04 NOTE — Progress Notes (Signed)
CTSP forFHTS in 80s for eight minutes.  GOOD LTV' Pt with pressure and pain from contractions FSE placed by CNM and pt given different position changes.   The fetal strip improved and maintained good variability PT was given an epidural.   Blood pressure and vitals remained stable.   Anticipate SVD

## 2021-12-04 NOTE — Plan of Care (Signed)
Completed.

## 2021-12-05 ENCOUNTER — Encounter (HOSPITAL_COMMUNITY)
Admission: AD | Disposition: A | Discharge status: Home / Self Care | Payer: Self-pay | Source: Home / Self Care | Attending: Obstetrics & Gynecology

## 2021-12-05 LAB — CBC
HCT: 25.1 % — ABNORMAL LOW (ref 36.0–46.0)
Hemoglobin: 8.5 g/dL — ABNORMAL LOW (ref 12.0–15.0)
MCH: 29.6 pg (ref 26.0–34.0)
MCHC: 33.9 g/dL (ref 30.0–36.0)
MCV: 87.5 fL (ref 80.0–100.0)
Platelets: 156 10*3/uL (ref 150–400)
RBC: 2.87 MIL/uL — ABNORMAL LOW (ref 3.87–5.11)
RDW: 13.7 % (ref 11.5–15.5)
WBC: 19 10*3/uL — ABNORMAL HIGH (ref 4.0–10.5)
nRBC: 0 % (ref 0.0–0.2)

## 2021-12-05 LAB — RPR: RPR Ser Ql: NONREACTIVE

## 2021-12-05 SURGERY — DILATION AND CURETTAGE
Anesthesia: Choice

## 2021-12-05 NOTE — Progress Notes (Addendum)
PPD# 1 SVD w/ 1st degree laceration Information for the patient's newborn:  Margaret, [name] [[id number]]  female   [name] Circumcision request in pt.   S:   Reports feeling "Much better than yesterday" Denies lightheadedness, dizziness, extreme fatigue Tolerating PO fluid and solids No nausea or vomiting Bleeding is light Pain controlled with ibuprofen (OTC) Up ad lib / ambulatory / voiding w/o difficulty Feeding: Breast    O:   VS: BP 99/76    Pulse 65    Temp 97.7 F (36.5 C) (Oral)    Resp 18    Ht 5\' 9"  (1.753 m)    Wt 81 kg    LMP 12/13/2020    SpO2 100%    Breastfeeding Unknown    BMI 26.36 kg/m   LABS:  Recent Labs    12/04/21 2027 12/05/21 0423  WBC 22.2* 19.0*  HGB 9.4* 8.5*  PLT 182 156   Blood type: --/--/B POS (02/11 1115) Rubella: Immune (07/13 0000)                      I&O: Intake/Output      02/11 0701 02/12 0700 02/12 0701 02/13 0700   I.V. (mL/kg) 474.9 (5.9)    Other 146.8    IV Piggyback 810.8    Total Intake(mL/kg) 1432.5 (17.7)    Urine (mL/kg/hr) 2200 (1.1)    Emesis/NG output 500    Blood 2588    Total Output 5288    Net -3855.5         Urine Occurrence 2 x      Physical Exam: Alert and oriented X3 Lungs: Clear and unlabored Heart: regular rate and rhythm / no mumurs Abdomen: soft, non-tender, non-distended  Fundus: firm, non-tender Perineum: well approximated  Lochia: minimal Extremities: generalized edema, no calf pain, tenderness, or cords    A:  PPD # 1  Normal exam S/P PPH on 12/04/21, IV Iron 12/04/21, Unasyn 3gms x 1 dose.  P:  Routine post partum orders PO methergine q 6 hrs x 24 hours PO Iron D/C foley IV to PRNA status Breastfeeding assistance   Anticipate D/C on 12/06/21   Plan reviewed w/ Dr. 12/08/21, MSN, CNM 12/05/2021, 8:25 AM

## 2021-12-05 NOTE — Anesthesia Postprocedure Evaluation (Signed)
Anesthesia Post Note  Patient: Margaret [name]  Procedure(s) Performed: AN AD HOC LABOR EPIDURAL     Patient location during evaluation: Mother Baby Anesthesia Type: Spinal Level of consciousness: awake Pain management: satisfactory to patient Vital Signs Assessment: post-procedure vital signs reviewed and stable Respiratory status: spontaneous breathing Cardiovascular status: stable Anesthetic complications: no   No notable events documented.  Last Vitals:  Vitals:   12/05/21 0150 12/05/21 0430  BP: 98/75 99/76  Pulse: 67 65  Resp:    Temp:    SpO2:      Last Pain:  Vitals:   12/04/21 2031  TempSrc:   PainSc: 0-No pain   Pain Goal:                   Cephus Shelling

## 2021-12-05 NOTE — Progress Notes (Signed)
Assisted patient OOB for peri care after foley discontinued and ortho static B/P's done . Denies dizziness . Returned to chair with family at bedside

## 2021-12-05 NOTE — Lactation Note (Addendum)
This note was copied from a baby's chart. Lactation Consultation Note  Patient Name: Margaret [name] [id number] Date: 12/05/2021 Reason for consult: Initial assessment;Term History of breast augmentation surgery 2018, PPH, newborn rash Age:30 hours  LC in to visit with P2 Mom of term baby.  Mom had a PPH of EBL of 2588 ml.  Mom sitting up on couch.  Baby swaddled in GMOB's arms.  Mom states baby latched well and breastfed after birth for 10 mins. Mom was having a PPH and baby was fed a total of 1.5 ml colostrum.   Mom had expressed colostrum in the last 4 weeks of pregnancy and brought her colostrum in syringes (in frig).  Mom able to hand express colostrum easily.  LC recommended that baby remain STS as much as possible.  Once baby unwrapped, he started cueing.  Mom exposing her breast only because of her parents being in the room.  Baby too sleepy to latch to the breast.  Reviewed basics of cross cradle hold.  FOB finger fed using a syringe, 2 ml of colostrum.    [place] set up DEBP at bedside for Mom to add double pumping to support her milk supply due to her [place].  Hgb 8.5 this am.   Mom knows she can ask for help prn.    Maternal Data Has patient been taught Hand Expression?: Yes Does the patient have breastfeeding experience prior to this delivery?: Yes How long did the patient breastfeed?: 18 months with first  Feeding Mother's Current Feeding Choice: Breast Milk  LATCH Score Latch: Too sleepy or reluctant, no latch achieved, no sucking elicited.  Audible Swallowing: None  Type of Nipple: Everted at rest and after stimulation  Comfort (Breast/Nipple): Soft / non-tender  Hold (Positioning): Full assist, staff holds infant at breast  LATCH Score: 4   Interventions Interventions: Breast feeding basics reviewed;Assisted with latch;Skin to skin;Breast massage;Hand express;Support pillows;Adjust position;Position options;DEBP;Hand pump;LC Services brochure  Discharge Pump:  Personal ([place]) WIC Program: No  Consult Status Consult Status: Follow-up Date: 12/06/21 Follow-up type: In-patient    Broadus John 12/05/2021, 12:14 PM

## 2021-12-05 NOTE — Lactation Note (Signed)
This note was copied from a baby's chart. Lactation Consultation Note  Patient Name: Boy Naylah Cork [id number] Date: 12/05/2021 Reason for consult: Follow-up assessment;Mother's request;Term;Nipple pain/trauma;Breastfeeding assistance Age:30 hours  LC assisted with getting a deeper latch in cross cradle prone. Mom noted increase in uterine contraction during the feeding. Mom to offer more volume with breast compression.  Infant since 5 am 2 stool and 1 urine.   Plan 1. To feed based on cues 8-12x 24hr period. Mom to offer breasts and look for signs of milk transfer.  2. Mom to hand express and offer colostrum to initiate a suck if infant not waking for feeding. LC did review BF supplementation volume, mom aware to offer more if infant not latching.  3. I and O sheet charged on mothers phone.   Mom states as long infant latching, she will not use the pump and just offer the breast.  All questions answered at the end of the visit.   Maternal Data Has patient been taught Hand Expression?: Yes  Feeding Mother's Current Feeding Choice: Breast Milk  LATCH Score                    Lactation Tools Discussed/Used    Interventions Interventions: Breast feeding basics reviewed;Support pillows;Assisted with latch;Position options;Skin to skin;Expressed Clinical research associate;Infant Driven Feeding Algorithm education;Hand express;Breast compression;Adjust position  Discharge    Consult Status Consult Status: Follow-up Date: 12/06/21 Follow-up type: In-patient    Martin Smeal  N[place] 12/05/2021, 4:18 PM

## 2021-12-06 NOTE — Lactation Note (Signed)
This note was copied from a baby's chart. Lactation Consultation Note  Patient Name: Boy Nelwyn Austell [id number] Date: 12/06/2021 Reason for consult: Follow-up assessment;Term;Infant weight loss;Breast augmentation;Other (Comment);Nipple pain/trauma (baby latched when LC entered the room with depth and consistent swallowing pattern. per mom feels like milk is coming in. per mom nipples aliitle sore and using her comfort gels brought from home.) Nipple well rounded when the baby released.  Age:31 years LC reviewed BF D/C teaching.  LC discussed with mom due to her breast implants , think prevention with engorgement. If to full to start release down the fullness so the areola is compressible prior to latching. Or when milk comes in if baby only feed the 1st breast release the other breast down to comfort.  Mom aware of her [place] resources after D/C.    Maternal Data    Feeding Mother's Current Feeding Choice: Breast Milk  LATCH Score Latch:  (latched with depth)  Audible Swallowing:  (swallows noted)  Type of Nipple:  (nipple well rounded when baby released)  Comfort (Breast/Nipple):  (per mom comfortable)  Hold (Positioning):  (mom latched the baby before [place] entered the room)      Lactation Tools Discussed/Used Tools: Shells Pump Education: Milk Storage  Interventions Interventions: Breast feeding basics reviewed;Shells;Education;LC Services brochure  Discharge Discharge Education: Engorgement and breast care;Warning signs for feeding baby Pump: Personal;DEBP;Manual  Consult Status Consult Status: Complete Date: 12/06/21    Jerlyn Ly [facility] 12/06/2021, 11:04 AM

## 2021-12-06 NOTE — Discharge Summary (Signed)
[facility] [place] Discharge Summary   Patient Name:   Margaret [name] DOB:     August 03, 1991 MRN:     [id number]  Date of Admission:   12/04/2021 Date of Discharge:  12/06/2021  Admitting diagnosis:    Encounter for induction of labor [Z34.90] Post-dates pregnancy [O48.0] Principal Problem:   Encounter for induction of labor Active Problems:   Post-dates pregnancy   SVD (spontaneous vaginal delivery)   Normal postpartum course   PPH (postpartum hemorrhage)   Acute blood loss anemia     Discharge diagnosis:    Encounter for induction of labor [Z34.90] Post-dates pregnancy [O48.0] Principal Problem:   Encounter for induction of labor Active Problems:   Post-dates pregnancy   SVD (spontaneous vaginal delivery)   Normal postpartum course   PPH (postpartum hemorrhage)   Acute blood loss anemia  Term Pregnancy Delivered        Additional problems: None                                          Post partum procedures: none Augmentation: AROM Complications: [id number]  Hospital course: Induction of Labor With Vaginal Delivery   31 y.o. yo G2P2002 at 49w2dwas admitted to the hospital 12/04/2021 for induction of labor.  Indication for induction: Favorable cervix at term.  Patient had an uncomplicated labor course as follows: Membrane Rupture Time/Date: 12:30 PM ,12/04/2021   Delivery Method:Vaginal, Spontaneous  Episiotomy: None  Lacerations:  1st degree  Details of delivery can be found in separate delivery note.  Patient had a routine postpartum course. Patient is discharged home 12/06/21.  Newborn Data: Birth date:12/04/2021  Birth time:5:35 PM  Gender:Female  Living status:Living  Apgars:8 ,9  Weight:3020 g   Magnesium Sulfate received: No BMZ received: No Rhophylac:N/A MMR:No T-DaP:Given prenatally Flu: No Transfusion:No                                                               Type of Delivery:  NSVD Delivering Provider: MNoralyn Pick Date  of Delivery:  12/04/21  Newborn Data:  Baby Feeding:   Breast Disposition:   home with mother  Physical Exam:   Vitals:   12/05/21 1030 12/05/21 1546 12/05/21 2135 12/06/21 0516  BP:  109/60 108/65 (!) 101/56  Pulse:  73 65 69  Resp:  _0 Temp:  98.4 F (36.9 C) 98.6 F (37 C) 98.2 F (36.8 C)  TempSrc:  Oral Oral Oral  SpO2: 100% 100% 100% 100%  Weight:      Height:       General: alert, cooperative, and no distress Lochia: appropriate Uterine Fundus: firm Incision: N/A DVT Evaluation: No evidence of DVT seen on physical exam. Negative Homan's sign. No cords or calf tenderness.  Labs: Lab Results  Component Value Date   WBC 19.0 (H) 12/05/2021   HGB 8.5 (L) 12/05/2021   HCT 25.1 (L) 12/05/2021   MCV 87.5 12/05/2021   PLT 156 12/05/2021   CMP Latest Ref Rng & Units 01/28/2021  Glucose 70 - 99 mg/dL 95  BUN 6 - 20 mg/dL 9  Creatinine 0.44 - 1.00 mg/dL 0.73  Sodium 135 -  145 mmol/L 137  Potassium 3.5 - 5.1 mmol/L 4.0  Chloride 98 - 111 mmol/L 107  CO2 22 - 32 mmol/L 24  Calcium 8.9 - 10.3 mg/dL 9.1  Total Protein 6.5 - 8.1 g/dL 6.6  Total Bilirubin 0.3 - 1.2 mg/dL 0.7  Alkaline Phos 38 - 126 U/L 49  AST 15 - 41 U/L 17  ALT 0 - 44 U/L 15    Discharge instruction: per After Visit Summary and "Baby and Me Booklet".  After Visit Meds:  Allergies as of 12/06/2021   No Known Allergies      Medication List     TAKE these medications    ferrous sulfate 325 (65 FE) MG tablet Take 325 mg by mouth daily with breakfast.   multivitamin-prenatal 27-0.8 MG Tabs tablet Take 1 tablet by mouth daily at 12 noon.        Diet: routine diet  Activity: Advance as tolerated. Pelvic rest for 6 weeks.   Outpatient follow up:6 weeks Follow up Appt:No future appointments. Follow up visit: No follow-ups on file.  Postpartum contraception: Progesterone only pills  12/06/2021 Sanjuana Kava, MD

## 2021-12-07 LAB — SURGICAL PATHOLOGY

## 2021-12-09 ENCOUNTER — Inpatient Hospital Stay (HOSPITAL_COMMUNITY)
Admission: AD | Admit: 2021-12-09 | Payer: No Typology Code available for payment source | Source: Home / Self Care | Admitting: Obstetrics and Gynecology

## 2021-12-09 ENCOUNTER — Inpatient Hospital Stay (HOSPITAL_COMMUNITY): Payer: No Typology Code available for payment source

## 2021-12-14 ENCOUNTER — Telehealth (HOSPITAL_COMMUNITY): Payer: Self-pay

## 2021-12-14 NOTE — Telephone Encounter (Signed)
No answer. Left message to return nurse call.  Marcelino Duster [facility] 12/14/2021,1446

## 2023-03-04 IMAGING — US US OB < 14 WEEKS - US OB TV
1 series · 15 of 28 positions shown · non-contrast
Comparison: None.

CLINICAL DATA: 29-year-old female with pregnancy of unknown
location. Estimated gestational age by LMP 6 weeks and 4 days.

EXAM:
OBSTETRIC <14 WK US AND TRANSVAGINAL OB US
TECHNIQUE: Both transabdominal and transvaginal ultrasound examinations were
performed for complete evaluation of the gestation as well as the
maternal uterus, adnexal regions, and pelvic cul-de-sac.
Transvaginal technique was performed to assess early pregnancy.

[Series 1: us ob < 14 weeks - us ob tv · 15 of 51 slices shown]
[im 1/51]
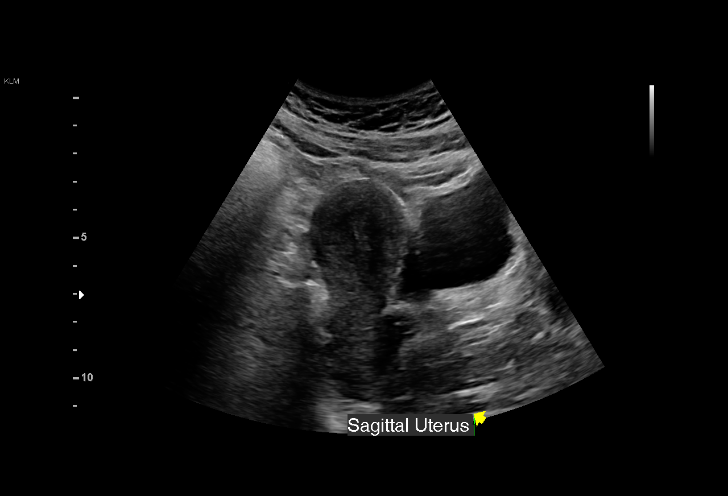
[im 4/51]
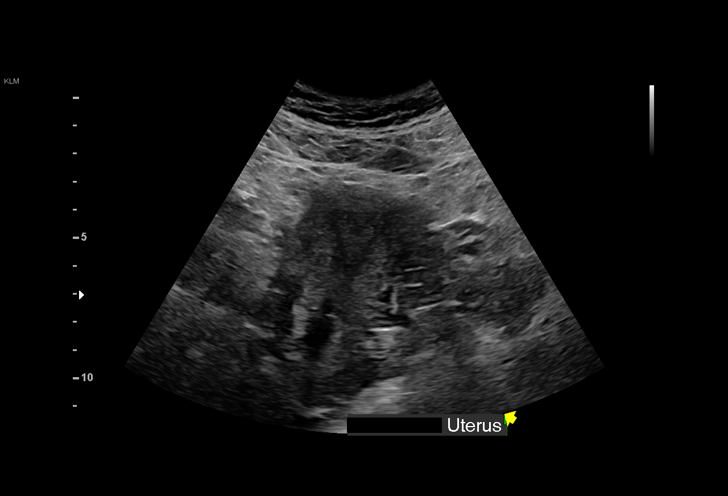
[im 8/51]
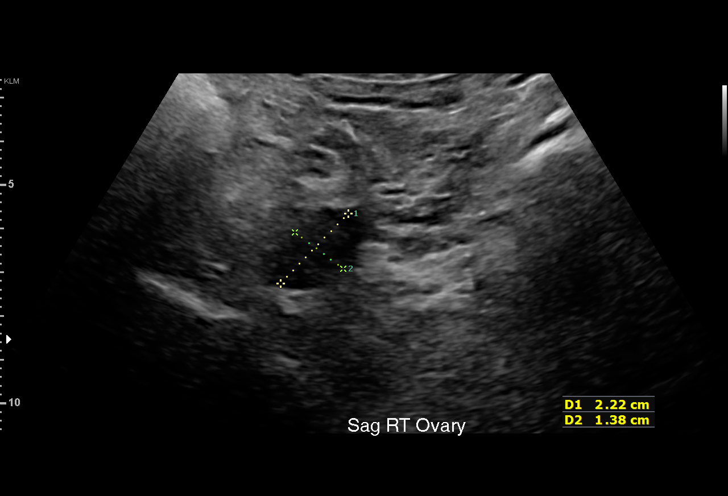
[im 12/51]
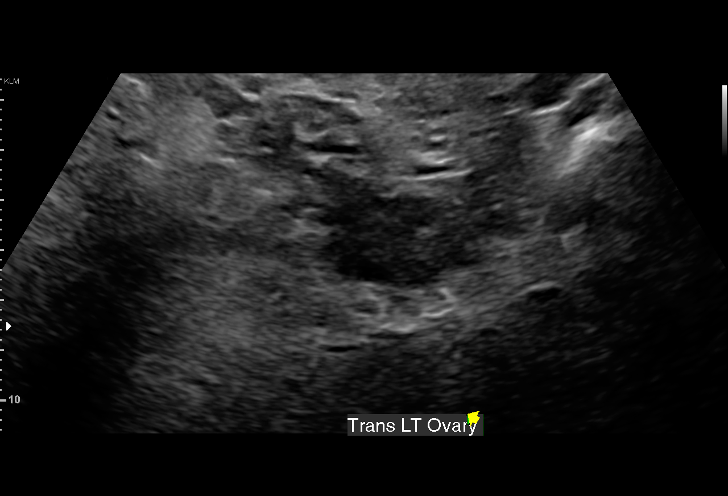
[im 15/51]
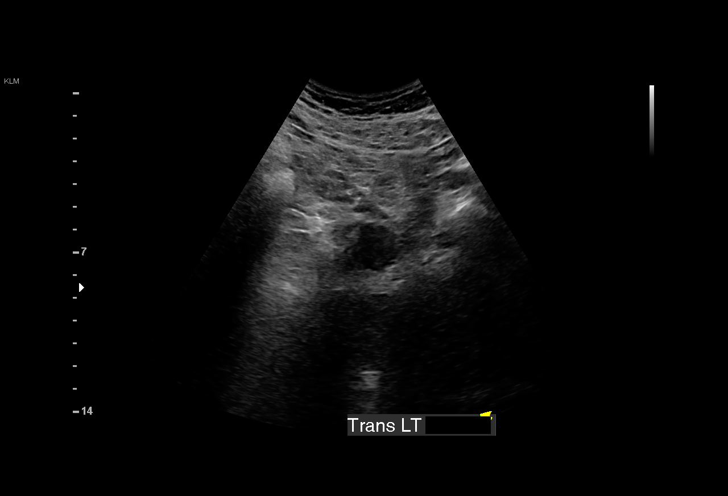
[im 19/51]
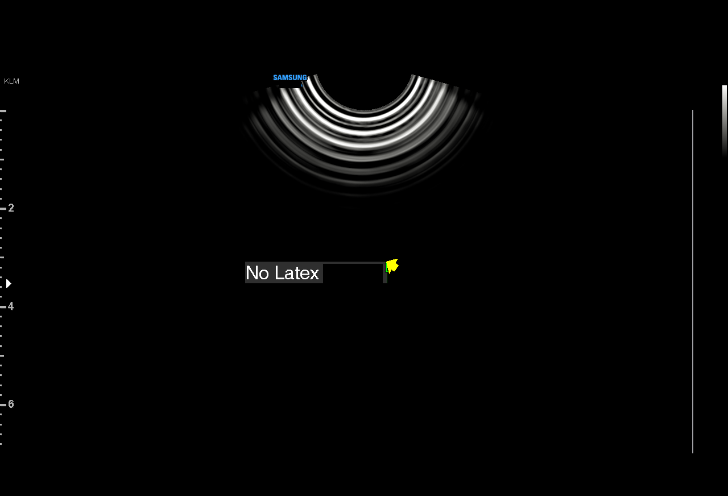
[im 23/51]
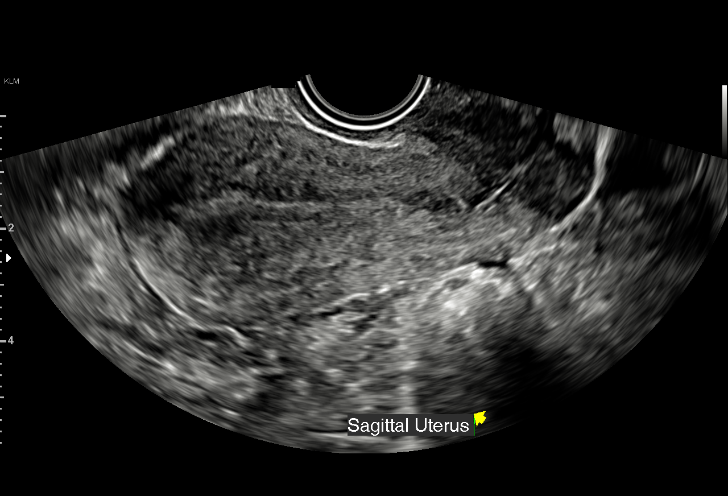
[im 26/51]
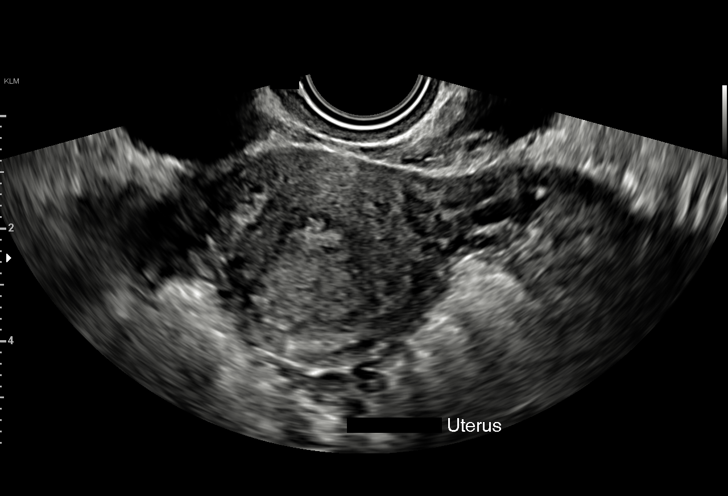
[im 28/51]
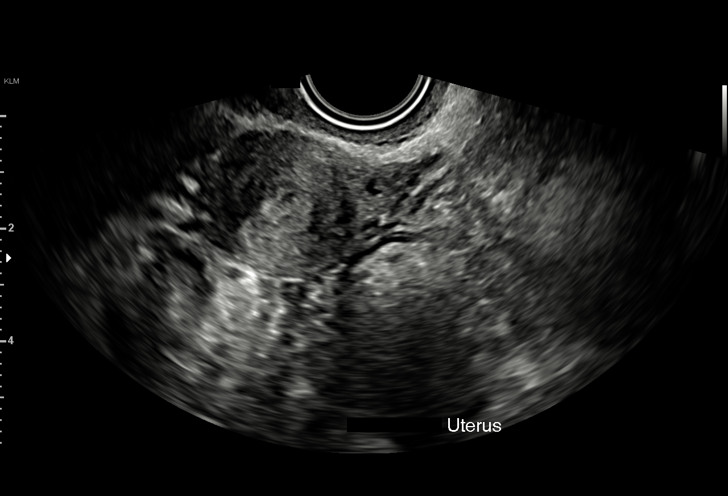
[im 32/51]
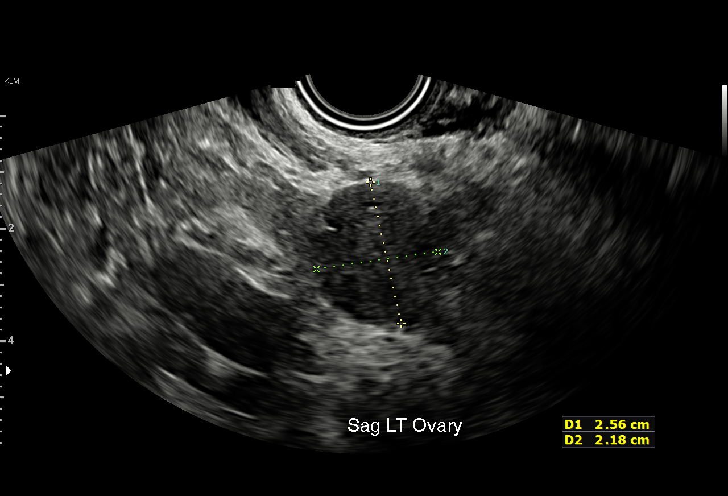
[im 36/51]
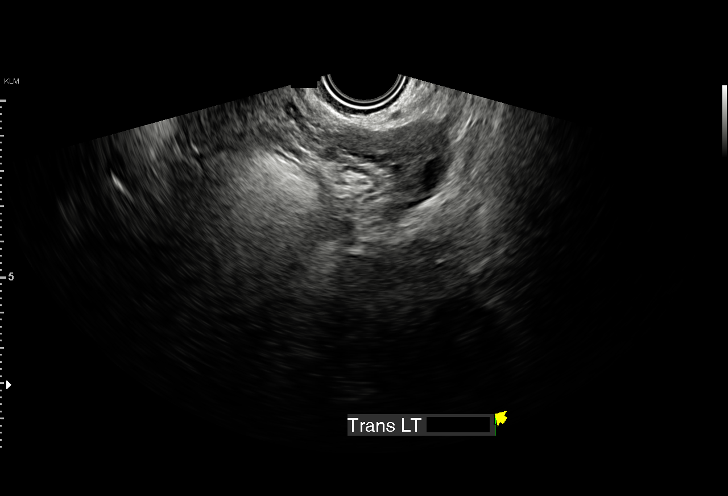
[im 39/51]
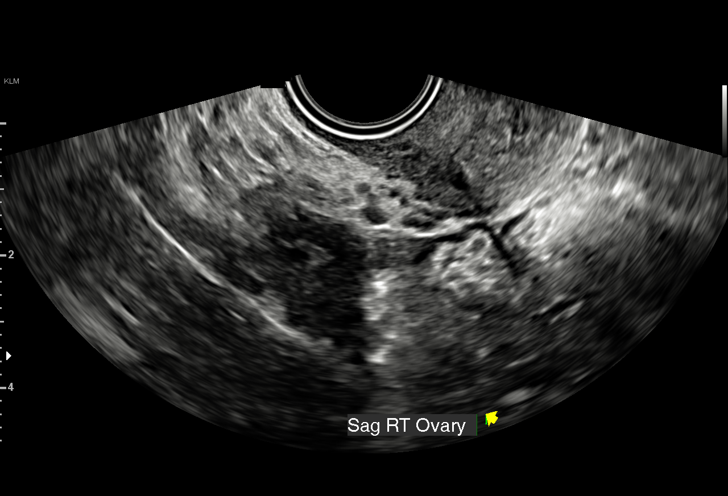
[im 43/51]
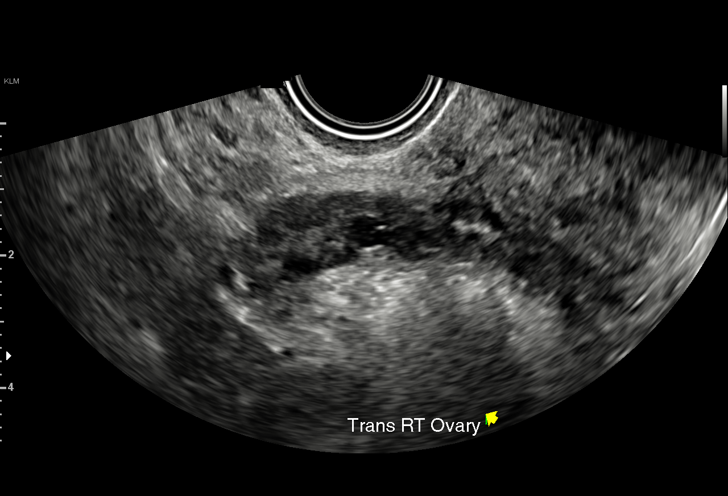
[im 47/51]
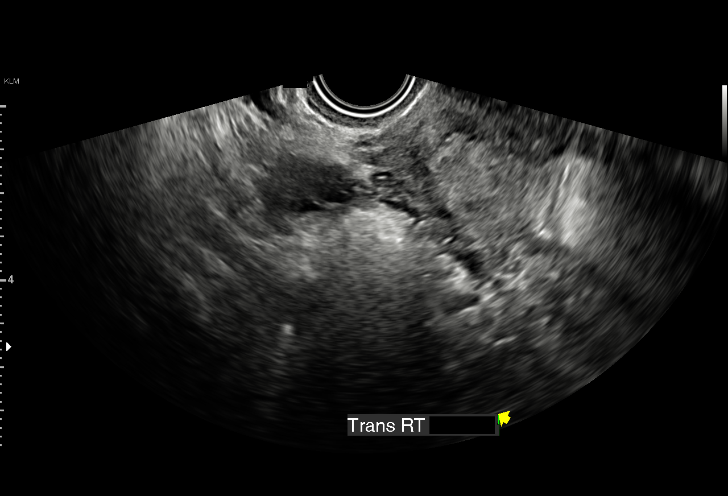
[im 51/51]
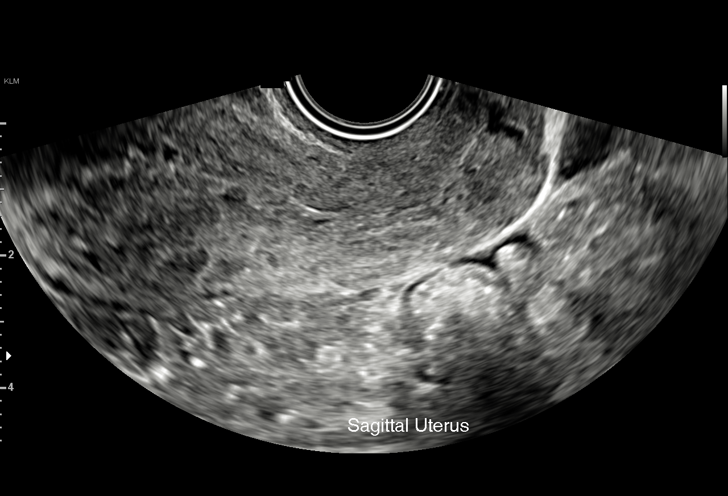

[15 of 28 positions shown; findings below may reference images not displayed]

FINDINGS: Intrauterine gestational sac: None.

Maternal uterus/adnexae: Bland appearance of the endometrium (image
51), measuring 8-9 mm in thickness. No pelvic free fluid. Both
ovaries appear normal. The right ovary measures 1.8 x 1.3 x 2.4 cm.
The left ovary is 2.5 x 2.6 x 2.2 cm.
IMPRESSION: No IUP, ovarian abnormality, or pelvic free fluid.

Differential considerations include early IUP, failed IUP, occult
ectopic pregnancy.

Recommend serial quantitative beta HCG and repeat Ultrasound as
necessary.

## 2024-06-26 NOTE — Progress Notes (Addendum)
 Subjective Patient ID: Margaret [name] is a 33 y.o. female.    Margaret [name] is a 33 y.o. female presents to the clinic complaining nasal congestion, cough, headache, ear pain, subjective fever for the past 5 days.  Patient is currently [redacted]w[redacted]d pregnant.  She has been taking Tylenol .  Patient also took some leftover amoxicillin last night which she states helped her symptoms.  Patient reports other family members have been sick but are better, however, patient is not.     History provided by:  Patient Language interpreter used: No     Review of Systems  Constitutional:  Positive for chills and fever. Negative for fatigue.  HENT:  Positive for congestion and ear pain. Negative for ear discharge, postnasal drip, rhinorrhea, sinus pressure, sinus pain, sore throat, trouble swallowing and voice change.   Respiratory:  Positive for cough. Negative for chest tightness and shortness of breath.   Musculoskeletal:  Negative for myalgias.  Neurological:  Positive for headaches.    Patient History  Allergies: No Known Allergies  History reviewed. No pertinent past medical history. History reviewed. No pertinent surgical history. Social History   Socioeconomic History  . Marital status: Married    Spouse name: Not on file  . Number of children: Not on file  . Years of education: Not on file  . Highest education level: Not on file  Occupational History  . Not on file  Tobacco Use  . Smoking status: Never  . Smokeless tobacco: Never  Vaping Use  . Vaping status: Never Used  Substance and Sexual Activity  . Alcohol use: Never  . Drug use: Never  . Sexual activity: Not on file  Other Topics Concern  . Not on file  Social History Narrative  . Not on file   History reviewed. No pertinent family history. Current Outpatient Medications on File Prior to Visit  Medication Sig Dispense Refill  . multivitamin (Prenatal) 27-0.8 MG tablet Take 1 tablet by mouth.    . [DISCONTINUED] Nikki 3-0.02 MG  tablet      No current facility-administered medications on file prior to visit.    Objective  Vitals:   06/26/24 0811  BP: 120/82  Pulse: 100  Resp: 18  Temp: (!) 35.6 C (96 F)  SpO2: 100%  Weight: 76.2 kg  Height: 5' 8  PainSc: 0-No pain        OBGYN/Pregnancy Status: Pregnant      No results found.  Physical Exam Vitals and nursing note reviewed.  Constitutional:      Appearance: Normal appearance.  HENT:     Head: Normocephalic.     Right Ear: Ear canal and external ear normal. A middle ear effusion is present.     Left Ear: Ear canal and external ear normal. A middle ear effusion is present. Tympanic membrane is injected.     Nose: No congestion or rhinorrhea.     Right Turbinates: Not enlarged or swollen.     Left Turbinates: Not enlarged or swollen.     Mouth/Throat:     Lips: Pink.     Mouth: Mucous membranes are moist.     Tongue: No lesions.     Palate: No lesions.     Pharynx: Oropharynx is clear. Uvula midline. No posterior oropharyngeal erythema.     Tonsils: No tonsillar exudate. 0 on the right. 0 on the left.  Eyes:     Conjunctiva/sclera: Conjunctivae normal.  Neck:     Trachea: Phonation normal.  Cardiovascular:  Rate and Rhythm: Normal rate and regular rhythm.     Heart sounds: Normal heart sounds.  Pulmonary:     Effort: Pulmonary effort is normal.     Breath sounds: Normal breath sounds and air entry.     Comments: Speaking in full sentences, no increase in work of breathing ambulating through the clinic.   Musculoskeletal:     Cervical back: Normal range of motion and neck supple. No rigidity. Normal range of motion.  Lymphadenopathy:     Head:     Right side of head: No submental, submandibular, tonsillar, preauricular, posterior auricular or occipital adenopathy.     Left side of head: No submental, submandibular, tonsillar, preauricular, posterior auricular or occipital adenopathy.     Cervical: No cervical adenopathy.   Skin:    General: Skin is warm and dry.     Capillary Refill: Capillary refill takes less than 2 seconds.  Neurological:     Mental Status: She is alert.  Psychiatric:        Mood and Affect: Mood normal.        Behavior: Behavior normal.     Results for orders placed or performed in visit on 06/26/24  POCT QuickVue Antigen Test  Component Result   Rapid Covid Negative   Internal Quality Control Pass  POCT RAPID INFLUENZA MCKESSON  Component Result   Rapid Influenza A Ag Negative   Rapid Influenza B Ag Negative   Internal Quality Control Pass       Procedures MDM:     1 Acute, uncomplicated illness or injury     Explanation of Medical Decision Making and variances from expected care:    Left AOM appreciated on exam.  Negative Covid and influenza.  Suspect other viral etiology as cause of symptoms.  Patient currently pregnant which limits OTC medication for symptom management.  Patient provided with list of safe medications.  RX amoxicillin for AOM.  Recheck if worsening or not improving.  Recommended PCP follow up as needed.  ER precautions given.     Unique ordered tests: Two     Review of any test results: Two     Assessment requiring historian other than patient: No     Independent visualization of image, tracing, or test: No     Discussion of management with another provider: No     Risk:: Moderate            Assessment/Plan Diagnoses and all orders for this visit:  Non-recurrent acute suppurative otitis media of left ear without spontaneous rupture of tympanic membrane -     amoxicillin (Amoxil) 875 MG tablet; Take 1 tablet (875 mg total) by mouth in the morning and 1 tablet (875 mg total) in the evening. Do all this for 5 days.  Nasal congestion -     POCT QuickVue Antigen Test -     POCT RAPID INFLUENZA MCKESSON  Sore throat -     POCT QuickVue Antigen Test -     POCT RAPID INFLUENZA MCKESSON  Acute cough -     POCT QuickVue Antigen Test -      POCT RAPID INFLUENZA MCKESSON     Disposition Status: Home  Patient Instructions  Discharge Instructions: Acute Otitis Media (Ear Infection)  Diagnosis: You have been diagnosed with acute otitis media, an infection or inflammation of the middle ear. This condition is common and often causes ear pain, pressure, hearing changes, and sometimes fever.  Treatment Plan:  1. Pain Management: You  may use over-the-counter Tylenol  (acetaminophen ) for ear pain or fever. Follow all dosing instructions on the medication label carefully. Important: Read all label warnings, especially if you have liver disease, kidney problems, or are taking other medications.  2. Antibiotics: Begin your oral antibiotic as prescribed. Take the full course even if you start feeling better before finishing the medication. To help prevent stomach upset or diarrhea, consider taking an over-the-counter probiotic during the course of antibiotic treatment.  Follow-Up Recommendations: Follow up with your primary care physician (PCP) within 1 week for an ear exam to ensure the infection has fully resolved. If you experience continued symptoms (such as ongoing ear pain, fever, or drainage from the ear) despite taking the antibiotic, contact your PCP promptly for further evaluation.  When to Seek Immediate Medical Attention: Worsening pain, swelling, or redness around the ear High fever (>= 102F / 38.9C) New hearing loss, dizziness, or balance problems Drainage of pus or blood from the ear Severe headache, stiff neck, or confusion     Cold medications that are considered safe during pregnancy, when used appropriately: Acetaminophen  (Tylenol ): This is generally considered safe for relieving fever, headaches, and body aches.  Always follow the recommended dosage.  Saline Nasal Spray or Drops: These can help relieve nasal congestion and are safe to use.  Dextromethorphan: This is a common ingredient in cough  suppressants (Delsym or Robitussin) and it's generally considered safe.  Chlorpheniramine: This is an antihistamine that may be recommended for allergy symptoms or a runny nose.   Medications to avoid during pregnancy:  Decongestants (like pseudoephedrine or phenylephrine ): These may restrict blood flow to the placenta.  Aspirin and ibuprofen    Progress note signed by Gerard Gaskins, PA on 06/26/24 at  8:52 AM

## 2024-06-26 NOTE — Progress Notes (Addendum)
 Pt presents with cough producing greenish phlegm, headache, ear pain, fever x 5 days  Answers submitted by the patient for this visit: Cough Questionnaire (Submitted on 06/25/2024) Chief Complaint: Cough Chronicity: new Onset: in the past 7 days Progression since onset: gradually worsening Frequency: every few minutes Cough characteristics: non-productive, productive of brown sputum ear congestion: Yes ear pain: Yes headaches: Yes nasal congestion: Yes postnasal drip: Yes rhinorrhea: Yes sore throat: Yes Aggravated by: lying down
